# Patient Record
Sex: Female | Born: 1975 | Race: Black or African American | Hispanic: No | Marital: Married | Smoking: Never smoker
Health system: Southern US, Community
[De-identification: ages and names within clinical notes are randomized; demographics above are authoritative.]

## PROBLEM LIST (undated history)

## (undated) DIAGNOSIS — N809 Endometriosis, unspecified: Secondary | ICD-10-CM

## (undated) DIAGNOSIS — I10 Essential (primary) hypertension: Secondary | ICD-10-CM

## (undated) DIAGNOSIS — D259 Leiomyoma of uterus, unspecified: Secondary | ICD-10-CM

## (undated) HISTORY — PX: MYOMECTOMY: SHX85

## (undated) HISTORY — DX: Essential (primary) hypertension: I10

---

## 2003-04-07 ENCOUNTER — Emergency Department (HOSPITAL_COMMUNITY): Admission: EM | Admit: 2003-04-07 | Discharge: 2003-04-07 | Payer: Self-pay | Admitting: Emergency Medicine

## 2003-04-07 ENCOUNTER — Encounter: Payer: Self-pay | Admitting: Emergency Medicine

## 2003-05-02 ENCOUNTER — Encounter: Admission: RE | Admit: 2003-05-02 | Discharge: 2003-05-22 | Payer: Self-pay | Admitting: General Practice

## 2005-12-29 ENCOUNTER — Ambulatory Visit (HOSPITAL_COMMUNITY): Admission: RE | Admit: 2005-12-29 | Discharge: 2005-12-29 | Payer: Self-pay | Admitting: Family Medicine

## 2005-12-29 ENCOUNTER — Emergency Department (HOSPITAL_COMMUNITY): Admission: EM | Admit: 2005-12-29 | Discharge: 2005-12-29 | Payer: Self-pay | Admitting: Family Medicine

## 2006-05-04 ENCOUNTER — Other Ambulatory Visit: Admission: RE | Admit: 2006-05-04 | Discharge: 2006-05-04 | Payer: Self-pay | Admitting: Obstetrics and Gynecology

## 2006-06-20 ENCOUNTER — Ambulatory Visit (HOSPITAL_COMMUNITY): Admission: RE | Admit: 2006-06-20 | Discharge: 2006-06-20 | Payer: Self-pay | Admitting: Obstetrics and Gynecology

## 2006-09-14 ENCOUNTER — Inpatient Hospital Stay (HOSPITAL_COMMUNITY): Admission: RE | Admit: 2006-09-14 | Discharge: 2006-09-16 | Payer: Self-pay | Admitting: Obstetrics and Gynecology

## 2006-09-14 ENCOUNTER — Encounter (INDEPENDENT_AMBULATORY_CARE_PROVIDER_SITE_OTHER): Payer: Self-pay | Admitting: *Deleted

## 2008-02-08 ENCOUNTER — Inpatient Hospital Stay (HOSPITAL_COMMUNITY): Admission: RE | Admit: 2008-02-08 | Discharge: 2008-02-11 | Payer: Self-pay | Admitting: Obstetrics and Gynecology

## 2009-04-10 ENCOUNTER — Encounter: Admission: RE | Admit: 2009-04-10 | Discharge: 2009-04-10 | Payer: Self-pay | Admitting: Internal Medicine

## 2011-02-09 NOTE — Discharge Summary (Signed)
NAME:  NYREE, APPLEGATE               ACCOUNT NO.:  [id number]   MEDICAL RECORD NO.:  [id number]          PATIENT TYPE:  INP   LOCATION:  9117                          [facility]  [facility]   PHYSICIAN:  Naima A. Dillard, M.D. DATE OF BIRTH:  06-Sep-1976   DATE OF ADMISSION:  02/08/2008  DATE OF DISCHARGE:  02/11/2008                               DISCHARGE SUMMARY   HISTORY OF PRESENT ILLNESS:  The patient is a 35 year old gravida 1,  para 0, who presented on the date of admission at 38-6/7 weeks for a  scheduled cesarean section due to a history of a myomectomy.  Her  pregnancy had been followed by MD service at [facility].  Dr. Estanislado Pandy was her  primary and history is remarkable for: (1) Status post myomectomy, (2)  history of fibroids, (3) history of Haemophilus/pertussis vaccine, (5)  father of the baby is a twin.   HOSPITAL COURSE:  She was admitted on the day of admission for the  scheduled C-section.  She was prepped for the OR and after which  proceeded to OR, where a primary low transverse cesarean section was  performed by Dr. Dois Davenport Rivard with Wynelle Bourgeois, C.N.M. assisting.  Procedure was performed under spinal anesthesia and findings were a  viable female infant with delivery at 0950 on Feb 08, 2008 with a weight  of 7 pounds 6 ounces, length of 20 inches; Apgars were 8 and 9.  Estimated blood loss was 900 mL.  During the procedure, Dr. Estanislado Pandy  decided to exteriorize the uterus to evaluate posterior wall and no wall  defects were noted due to the pregnancy.  There were some very fine  adhesions on the posterior aspect of the uterus, which were sharply  dissected.  Tubes and ovaries appeared normal and uterus was returned  into the abdominopelvic cavity.  Baby's name was Jackelyn Hoehn.  The patient  tolerated it well and was taken to recovery room in stable and good  condition and newborn female was sent to full-term nursery.  During the  procedure, to note, sharp removal of keloid from her  previous incision  was done and the incision was vertically brought sharply to the fascia.  By postoperative day #1, the patient was doing well.  She was  spontaneously voiding.  Her vital signs were stable.  She had a T-max of  99.1.  Physical exam was within normal limits.  Abdomen was soft and  appropriately tender.  Dressing was clean, dry and intact.  Lochia  within normal limits.  Her hemoglobin was down to 11.8 from 12.0, white  count stable at 9.4, platelets were stable at 217,000 and plan was made  to continue with routine postoperative care.  By postoperative day #2,  she continued doing well.  She was ambulating, voiding without  difficulty, tolerating foods and fluids.  She was without dizziness or  weakness, positive flatus, no BM.  She was breast- and bottle-feeding.  Vital signs were stable and she was afebrile.  Her physical exam was  within normal limits.  Chest was clear.  Breasts were soft.  Fundus was  firm, below umbilicus.  She had small lochia rubra.  Incision was clean,  dry and intact without redness and staples were intact and by  postoperative day #3, the patient was well.  She was ready for  discharge, continued breast- and bottle-feeding.  She had light vaginal  bleeding.  Pain was well-controlled with Motrin and Percocet.  She has a  6-week appointment already scheduled in the office and the patient was  deemed to have received full benefit from her hospital stay and was  discharged home in stable condition on postoperative day #3.  Vital  signs remained stable and she was afebrile.  Her abdomen did remain  soft.  Fundus was firm, 1/U.  Her vertical midline Steri-Strips were dry  and intact with some old dark drainage inferior to pannus.  Her staples,  which vertically followed her incision, were removed with some  discomfort, but the patient received pain medications just following  procedure.  She had scant lochia rubra.  Extremities with 1+ pitting  edema,  bilateral lower extremities, but negative Homans.   DISCHARGE MEDICATIONS:  1. Motrin 1 tab p.o. q.6 h. p.r.n. pain.  2. Percocet 5/325 one to two tabs p.o. q.4-6 h. p.r.n. pain.  3. Encouraged stool softeners such as Colace 1 tab p.o. daily or      b.i.d. p.r.n. constipation.  4. Continue a daily multivitamin or prenatal vitamin.   DISCHARGE INSTRUCTIONS:  Postpartum instructions were per [facility] pamphlet.  Warning signs and symptoms to report were reviewed.  Followup is to  occur in 6 weeks or p.r.n.   ADMITTING DIAGNOSES:  1. Intrauterine pregnancy at 38-6/7 weeks.  2. Previous myomectomy.  3. Desiring cesarean delivery.   DISCHARGE DIAGNOSES:  1. Status post primary low transverse cesarean section secondary to a      previous myomectomy with findings of a viable female infant by the      name of Jackelyn Hoehn, who was born at 91, Feb 08, 2007, with a weight      of 7 pounds 6 ounces and Apgars 8 and 9.  2. Intrauterine pregnancy at term.  3. Breast-feeding and bottle-feeding.   HOSPITAL PROCEDURES:  1. Spinal anesthesia.  2. Primary low transverse cesarean section.      Candice Bellwood, CNM      Naima A. Normand Sloop, M.D.  Electronically Signed    CHS/MEDQ  D:  02/11/2008  T:  02/11/2008  Job:  [id number]

## 2011-02-09 NOTE — Op Note (Signed)
NAME:  Kristine Beard, Kristine Beard               ACCOUNT NO.:  [id number]   MEDICAL RECORD NO.:  [id number]          PATIENT TYPE:  INP   LOCATION:  9117                          [facility]   PHYSICIAN:  Crist Fat. Rivard, M.D. DATE OF BIRTH:  [birth year]   DATE OF PROCEDURE:  02/08/2008  DATE OF DISCHARGE:                               OPERATIVE REPORT   PREOPERATIVE DIAGNOSIS:  Intrauterine pregnancy at 38 weeks and 6 days  status post myomectomy.   POSTOPERATIVE DIAGNOSIS:  Intrauterine pregnancy at 38 weeks and 6 days  status post myomectomy.   ANESTHESIA:  Spinal.   PROCEDURE:  Primary low transverse cesarean section.   SURGEON:  Crist Fat. Rivard, MD   ASSISTANT:  Elby Showers. Mayford Knife, certified nurse midwife.   ESTIMATED BLOOD LOSS:  900 mL.   PROCEDURE:  After being informed of the planned procedure with possible  complications including bleeding, infection, injury to bowel, bladder,  or ureters, informed consent was obtained.  The patient was taken to OR  #1, given spinal anesthesia without complication.  She was placed in the  dorsal decubitus position, pelvis tilted to the left, prepped and draped  in a sterile fashion, and a Foley catheter was inserted in her bladder.   After assessing adequate level of anesthesia, we infiltrated the  vertical incision using 20 mL of Marcaine 0.25, and we sharply removed  the keloid previous incision.  This incision was then vertically brought  sharply to the fascia and the fascia was incised in the midline fashion.  Linea alba was identified.  The rectus muscle was moved to the side and  peritoneum was entered vertically.   OBSERVATION:  We see a normal appearing uterus with no adhesions in the  anterior aspect and a free moving bladder in the lower uterine segment.  A Alexis retractor is inserted easily and we now open the visceral  peritoneum in a low transverse fashion allowing [place] to safely retract  bladder by developing a bladder flap.   Myometrium is then easily entered  in a low transverse fashion first with knife then extended bluntly.  The  amniotic fluid is thin meconium.  We assist the birth of a female infant  at 9:50 a.m.  Mouth and nose are suctioned, a nuchal cord is reduced,  and the baby is delivered.  The cord was then clamped with two Kelly  clamps and sectioned and the baby is given to the pediatrician in the  room.  10 mL of blood is drawn from the umbilical vein and the placenta  is allowed to deliver spontaneously.  It is complete, cord has three  vessels, and uterine revision is negative.  Namely, there are no intra-  uterine syncytia.   At this time, we decided to exteriorize the uterus to evaluate its  posterior wall and we see no wall defect due to the pregnancy.  Very  fine adhesions on the posterior aspect of the uterus are sharply  dissected.  Both tubes and ovaries are normal.  The uterus was returned  into the abdominal pelvic cavity.   We now  proceed with closure of the myometrium in two layers, one with a  running locked suture of 0 Vicryl then with a Lembert suture of 0 Vicryl  imbricating the first one.  A figure-of-eight stitch is placed in the  middle of the incision to complete hemostasis.  We then cleaned the  paracolic gutters and irrigate profusely the pelvis with warm saline,  and hemostasis is completed on peritoneal edges with cauterization.   Retractors and sponges are removed.  Under fascia hemostasis is  completed with cautery and the fascia is closed with two running sutures  of 1 Vicryl meeting midline.  The wound is profusely irrigated with warm  saline and its hemostasis is completed with cautery.  We placed three  retention sutures of 0 Vicryl in order to be able to place our staples  on the skin in a satisfactory fashion.  The retention sutures are then  removed and Steri-Strips are placed in between each of the staples.   Instruments and sponge count is complete x2.   Estimated blood loss is  900 mL.  The procedure is very well tolerated by the patient, who was  taken to recovery room in a well and stable condition.   A little boy named Jackelyn Hoehn was born at 9:50 a.m. received an Apgar of 8  at 1 minute and 9 at 5 minutes and weighed 7 pounds 6 ounces.   SPECIMEN:  Placenta sent to labor and delivery.      Crist Fat Rivard, M.D.  Electronically Signed     SAR/MEDQ  D:  02/08/2008  T:  02/09/2008  Job:  [id number]

## 2011-02-09 NOTE — H&P (Signed)
NAME:  Kristine Beard, Kristine Beard               ACCOUNT NO.:  [id number]   MEDICAL RECORD NO.:  [id number]          PATIENT TYPE:  INP   LOCATION:  NA                            [facility]   PHYSICIAN:  Dois Davenport A. Rivard, M.D. DATE OF BIRTH:  May 02, 1976   DATE OF ADMISSION:  DATE OF DISCHARGE:                              HISTORY & PHYSICAL   HISTORY:  This is a 35 year old a gravida 1, para 0 at 36 and 6/7th  weeks, who presents for scheduled cesarean delivery due to a history of  myomectomy.  Pregnancy has been followed by Dr. Estanislado Pandy and remarkable  for:  1. Father of the baby is a twin.  2. History of Haemophilus pertussis vaccine.  3. Fibroids.  4. Status post myomectomy.   ALLERGIES:  CHLOROQUINE, causes itching.   OB HISTORY:  The patient is primigravida.   MEDICAL HISTORY:  Remarkable for history of multiple uterine fibroids  removed by a myomectomy in 2007, and childhood varicella.   SURGICAL HISTORY:  Remarkable for myomectomy in 2007, which was removed,  and multiple fibroids.   FAMILY HISTORY:  Remarkable for father with hypertension.   GENETIC HISTORY:  Remarkable for father of baby being a twin.   SOCIAL HISTORY:  The patient is married to Arvin Collard, who is  involved and supportive.  She is of the [place] faith.  She is a  Consulting civil engineer.  She denies any alcohol, tobacco, or drug use.   PRENATAL LABS:  Hemoglobin 12.4 and platelets 264.  Blood type O+.  Antibody screen negative.  Sickle cell negative.  Toxin negative.  RPR  nonreactive.  Rubella immune.  Hepatitis negative.  HIV negative.  Pap  test normal.  Gonorrhea negative.  Chlamydia negative.  Cystic fibrosis  negative.   HISTORY OF CURRENT PREGNANCY:  The patient entered care at 7 weeks'  gestation.  She had an ultrasound in the first trimester showing a  viable intrauterine pregnancy with a posterior fibroid.  She had some  nausea and vomiting treated with Phenergan.  She declined quad screen.  She had an  ultrasound for anatomy at 18 weeks that was normal.  She had  Glucola at 26 weeks that was 91, and she had a group B strep that was  negative.   OBJECTIVE DATA:  VITAL SIGNS:  Stable.  Afebrile.  HEENT:  Within normal limits.  Thyroid normal, not enlarged.  CHEST:  Clear to auscultation.  HEART:  Regular rate and rhythm.  ABDOMEN:  Gravid at 38 cm with a healed vertical abdominal scar.  PELVIC:  Deferred.  EXTREMITIES:  Within normal limits except for her mild edema.   ASSESSMENT:  1. Intrauterine pregnancy at 55 and 6/7th weeks.  2. Previous myomectomy.  3. Desires cesarean delivery.   PLAN:  1. Admit to operating suite per Dr. Estanislado Pandy.  2. Further orders to follow.      Marie L. Williams, C.N.M.      Crist Fat Rivard, M.D.  Electronically Signed    MLW/MEDQ  D:  02/07/2008  T:  02/08/2008  Job:  [id number]

## 2011-02-12 NOTE — Op Note (Signed)
NAME:  Kristine Beard, Kristine Beard               ACCOUNT NO.:  [id number]   MEDICAL RECORD NO.:  [id number]          PATIENT TYPE:  AMB   LOCATION:  SDC                           [facility]   PHYSICIAN:  Dois Davenport A. Rivard, M.D. DATE OF BIRTH:  1976-02-17   DATE OF PROCEDURE:  09/14/2006  DATE OF DISCHARGE:                               OPERATIVE REPORT   PREOPERATIVE DIAGNOSES:  1. Large fibroid uterus.  2. Dysmenorrhea.  3. Menorrhagia.  4. Anemia.  5. Left hydronephrosis.   POSTOPERATIVE DIAGNOSES:  1. Large fibroid uterus.  2. Dysmenorrhea.  3. Menorrhagia.  4. Anemia.  5. Left hydronephrosis.   ANESTHESIA:  General with endotracheal intubation.   PROCEDURE:  Abdominal myomectomy.   SURGEON:  Crist Fat. Rivard, M.D.   ASSISTANTMarquis Lunch. Lowell Guitar, P.A.   ESTIMATED BLOOD LOSS:  350 mL.   PROCEDURE:  After being informed of the planned procedure with possible  complications including bleeding, infection, injury to bowel, bladder or  ureters, postoperative tubal occlusion as well as growth of new  fibroids, informed consent is obtained.  The patient is taken to OR #3,  given general anesthesia with endotracheal intubation without  complication.  She is placed in the dorsal decubitus position, prepped  and draped in a sterile fashion.  A Foley catheter is inserted in her  bladder and a 8 [place] intrauterine catheter is inserted in her uterus.  We infiltrate from the suprapubic area to the umbilicus using 18 mL of  Marcaine 0.25 and we perform a midline incision until 1.5 cm of the  umbilicus.  This incision is brought to the fascia.  The fascia is  identified, incised in the midline fashion and the linea alba is  dissected.  Peritoneum is entered bluntly.   OBSERVATION:  The uterus is very large with a dominant posterior fibroid  measuring approximately 12 cm in diameter and then a right anterior  fibroid measuring 5 cm and a subserosal fibroid measuring 2 cm at the  right  cornua.  Tubes appeared normal, ovaries appeared normal.  It  should be noted that the left tube was completely compressed anteriorly  upon opening of the abdominal cavity.  The liver edge is felt and  normal.  Gallbladder is normal.  No other anomalies are noted in the  pelvic or abdominal cavity.  With some difficulty, the uterus was  exteriorized from the abdominal cavity and we infiltrate the serosa on  the posterior wall of the uterus using 10 mL of vasopressin 20 units in  50 mL dilution.  The serosa is then opened with cautery and the fibroid  is grasped with a corkscrew manipulator.  We then proceed with sharp and  blunt dissection of the fibroid away from the body of the uterus.  To be  noted that there are no clear planes of dissection and this fibroid is  quite soft either degenerating or has a component of adenomyosis.  Fibroid is removed completely, but in the process a small opening is  performed in the posterior endometrial cavity.  We closed that cavity  with  interrupted 2-0 Vicryl suture and then we proceed with systematic  closure of the myometrium in multiple planes using 0 Vicryl figure-of-  eight stitches and pursestring sutures.  The excess of serosa is excised  and we reapproximate the myometrium completely until the serosa is  reapproximated.  We then put our attention on the anterior fibroid and  we infiltrate the serosa with 10 mL of vasopressin 20 units in 50 mL  dilution and we opened the serosa with cautery.  The fibroid is grasped  with a towel clip and sharply and bluntly dissected away from the  uterine wall.  Again, there is no clear plane of dissection and we again  enter the endometrial cavity.  The fibroid is completely removed and the  endometrial cavity is closed with interrupted sutures of 2-0 Vicryl.  The myometrium is then closed with figure-of-eight stitches of 2-0  Vicryl.   Please note that before closing the posterior wall, another 3 cm  fibroid  was removed easily and had clear planes of dissection.  We then close  the serosa on both the anterior and posterior incision using 2-0 Vicryl  in a baseball suture.  Hemostasis is adequate.  The subserosal cornual  fibroid is then grasped with a towel clip and removed using  cauterization and blunt dissection a figure-of-eight stitch of 2-0  Vicryl is used to close the serosa.  Again hemostasis is adequate.  We  then profusely irrigate the pelvis with warm saline and note a  satisfactory hemostasis.  A sheet of intercede was placed on both  incisions and the uterus is returned in the pelvic cavity.  The under  fascia hemostasis is completed with cautery and the fascia is closed  with two running sutures of #1 Vicryl meeting midline.  The wound is  then irrigated with warm saline.  Hemostasis is completed with cautery  and the incision is closed with a subcuticular suture of 3-0 Monocryl  and Steri-Strips.   Instruments and sponge count is complete x2.  Estimated blood loss is  350 mL.  The procedure is very well tolerated by the patient who is  taken to recovery room in a well and stable condition.      Crist Fat Rivard, M.D.  Electronically Signed     SAR/MEDQ  D:  09/14/2006  T:  09/14/2006  Job:  [id number]

## 2011-02-12 NOTE — H&P (Signed)
NAME:  Kristine Beard, Kristine Beard NO.:  [id number]   MEDICAL RECORD NO.:  [id number]         PATIENT TYPE:  AMB   LOCATION:                                [facility]   PHYSICIAN:  Crist Fat. Rivard, M.D. DATE OF BIRTH:  [birth year]   DATE OF ADMISSION:  09/14/2006  DATE OF DISCHARGE:                              HISTORY & PHYSICAL   HISTORY OF PRESENT ILLNESS:  [name] is a 35 year old, married,  African-American female, para 0-0-1-0, who presents for myomectomy  because of a large symptomatic fibroid uterus and left hydronephrosis.  The patient was seen at [facility] in April 2007 with  complaints of abdominal pain.  She was found at that time to have a  normal CBC except for a hemoglobin of 11.1, a negative H. pylori test,  urinalysis, and pregnancy test, along with normal electrolytes.  The  patient's creatinine, however, was found to be 1.1.  An abdominal  ultrasound showed a 9 cm right kidney and a 10.1 cm left kidney with  blunting of calices and mild to moderate left hydronephrosis.  The  patient was then referred for urologic evaluation; however, she did not  follow through.  She was seen at [facility] in August 2007  with complaints of severe back pain, rectal pressure, heavy periods,  urinary frequency with urgency, flatulence, and positional dyspareunia.  Her periods were described as lasting 5 days with a change of a long,  heavy-flow pad every 4 hours.  The patient's cramps were rated at 8/10  on a 10-point pain scale; however, she was able to find relief with  Aleve.  A pelvic ultrasound at that time demonstrated a uterus measuring  18 x 10.5 x 15 cm with 3 measurable fibroids being 8.1 cm, 5.4 cm, and a  pedunculated 5.0 cm.  Multiple smaller ones were also noted.  The  patient's left and right ovaries appeared within normal limits.  A  subsequently histosalpingogram revealed an enlarged deviated endometrial  canal with patulous  morphology suggesting extrinsic compression from a  large left-sided fibroid.  The patient's right fallopian tube was  patent.  Her left fallopian tube showed no filling at the level of the  level of the cornua suggesting occlusion.  Given patient's desire to  preserve her fertility, she wishes to pursue management of her condition  through myomectomy.   OB HISTORY:  Gravida 1, para 0-0-1-0.   GYN HISTORY:  Menarche at 35 years old.  Last menstrual period August 29, 2006.  The patient has no history of sexually transmitted diseases or  abnormal Pap smears.  Her last Pap smear was August 2007.   PAST MEDICAL HISTORY:  Negative.   PAST SURGICAL HISTORY:  Negative.   FAMILY HISTORY:  Positive for hypertension.   SOCIAL HISTORY:  The patient is married, and she works as a Probation officer.   HABITS:  She does not use alcohol or tobacco.   CURRENT MEDICATIONS:  Aleve 2 tablets twice daily with food as needed.   ALLERGIES:  1. The  patient is allergic to CHLOROQUINE which causes itching.  2. She is also sensitive to VICODIN which makes her nauseous.   REVIEW OF SYSTEMS:  Negative for chest pain, shortness of breath,  headache, vision changes, nausea, vomiting, diarrhea, or fever.  Except  as mentioned in History of Present Illness, the patient's Review of  Systems is otherwise negative.   PHYSICAL EXAMINATION:  VITAL SIGNS: Blood pressure 130/80.  Weight 140,  height 5 feet 6 inches tall.  NECK:  Supple.  There are no masses, adenopathy, or thyromegaly.  HEART: Regular rate and rhythm.  The patient has a 2/6 systolic ejection  murmur at the left sternal border which is asymptomatic.  LUNGS: Clear to auscultation.  There are no wheezes, rales, or rhonchi.  BACK: No CVA tenderness.  ABDOMEN:  Without tenderness or organomegaly; however, there is a firm  mass to the level of patient's umbilicus arising from the pelvis which  is palpable.  EXTREMITIES:  No clubbing, cyanosis, or  edema.  PELVIC:  EG/BUS is within normal limits.  Vagina is rugose.  There is  dark blood in the vaginal vault.  Cervix is nontender without lesions.  Uterus appeared 18-week size, tender, and irregular.  Adnexa without  tenderness or palpable masses.   IMPRESSION:  1. Large symptomatic fibroid uterus.  2. Dysmenorrhea.  3. Menorrhagia.   DISPOSITION:  A discussion was held with the patient regarding the  indications for her procedure along with its risks which include but are  not limited to reaction to anesthesia, damage to adjacent organs,  infection, excessive bleeding, and the remote possibility that a  hysterectomy may become necessary.  The patient verbalized understanding  of these risks and has consented to proceed with an abdominal myomectomy  at [facility] on September 14, 2006, at 8:30 a.m.      Kristine Beard.      Crist Fat Rivard, M.D.  Electronically Signed    EJP/MEDQ  D:  09/12/2006  T:  09/12/2006  Job:  [id number]

## 2011-02-12 NOTE — Discharge Summary (Signed)
NAME:  Kristine Beard, Kristine Beard               ACCOUNT NO.:  [id number]   MEDICAL RECORD NO.:  [id number]          PATIENT TYPE:  INP   LOCATION:  9313                          FACILITY:  [facility]   PHYSICIAN:  Crist Fat. Rivard, M.D. DATE OF BIRTH:  04-19-1976   DATE OF ADMISSION:  09/14/2006  DATE OF DISCHARGE:  09/16/2006                               DISCHARGE SUMMARY   DISCHARGE DIAGNOSES:  1. Symptomatic uterine fibroids.  2. Adenomyosis.  3. Dysmenorrhea.  4. Menorrhagia.  5. Left hydronephrosis.   OPERATION:  On the date of admission, the patient underwent an abdominal  myomectomy, tolerating procedure well.  From the patient's markedly  enlarged uterus, four fibroids were removed with an aggregate weight of  963 grams.   HISTORY OF PRESENT ILLNESS:  Ms. Dershem is a 35 year old, married,  African American female, para 0-0-1-0, who presents for myomectomy  because of a large symptomatic fibroid uterus and left hydronephrosis.  Please see the patient's dictated History and Physical examination for  details.   PREOPERATIVE PHYSICAL EXAM:  VITAL SIGNS: Blood pressure 130/80. Weight  was 140. Height was 5 feet 6 inches tall. GENERAL:  Exam was within  normal limits.  ABDOMEN:  On abdominal exam, the patient had a firm mass arising from  the pelvis to the level of the patient's umbilicus which was without  tenderness, and there was no organomegaly.  PELVIC:  EG/BUS was within normal limits.  Vagina was rugose.  There was  dark blood in the vaginal wall  the cervix was nontender without  lesions.  Uterus appeared 18-week size, tender and irregular.  Adnexa  was without tenderness or palpable masses   HOSPITAL COURSE:  On the day of admission, the patient underwent  aforementioned procedure, tolerating it well.  Postoperative course was  marked by the patient spiking a temperature of 100.5 degrees Fahrenheit  orally on postop day #2; however, she quickly defervesced by postop day  #2.   Additionally, the patient had a postop hemoglobin of 6.8 (preop  hemoglobin 10.4).  However, she was only minimally symptomatic and  declined transfusion.   By postop day #2, the patient had resumed bowel and bladder function,  was no longer febrile, and was, therefore, deemed ready for discharge  home.  The patient's basic metabolic panel during her hospital stay was  found to be within normal limits.   DISCHARGE MEDICATIONS:  1. Percocet 5/325 mg 1-2 tablets every 4 hours as needed for pain.  2. Ibuprofen 600 mg with food every 6 hours for 3 days, then as needed      for pain.  3. Colace 100 mg two to three times daily until bowel movements are      regular.  4. Ferrous sulfate 325 mg twice daily for 6 weeks.  5. Phenergan 25 mg every 6 hours with nausea.   FOLLOWUP:  The patient is scheduled for 6-week postoperative visit with  Dr. Estanislado Pandy on October 25, 2006, at 11 o'clock a.m.   DISCHARGE INSTRUCTIONS:  The patient was given a copy of [facility] postoperative instruction sheet.  She was further  advised to avoid driving for 2 weeks, heavy lifting for 4 weeks,  intercourse for 6 weeks.  She may walk up steps.  She may shower and  bathe.  She may increase her activities slowly.  She is to keep her  incision clean, dry and that she is able to use soap and water.  Her  diet was without restriction.   FINAL PATHOLOGY:  Uterine fibroids: Benign leiomyomas and nodular foci  of adenomyosis associated with fibrosis and focal hemorrhage.   Peritoneum biopsy of the uterine serosa:  Focal hyperemia. No  endometriosis identified.      [place] J. Adline Peals.      Crist Fat Rivard, M.D.  Electronically Signed    EJP/MEDQ  D:  10/03/2006  T:  10/03/2006  Job:  [id number]

## 2011-06-23 LAB — CBC
HCT: 34.7 — ABNORMAL LOW
MCV: 91.4
Platelets: 217
RDW: 14.2

## 2013-05-01 ENCOUNTER — Other Ambulatory Visit: Payer: Self-pay | Admitting: Obstetrics

## 2013-05-21 ENCOUNTER — Encounter (HOSPITAL_COMMUNITY): Payer: Self-pay

## 2013-05-24 NOTE — H&P (Signed)
NAME:  Kristine Beard, Kristine Beard NO.:  [id number]  MEDICAL RECORD NO.:  [id number]  LOCATION:                                 FACILITY:  PHYSICIAN:  Kathreen Cosier, M.D.DATE OF BIRTH:  [birth year]  DATE OF ADMISSION:  05/31/2013 DATE OF DISCHARGE:                             HISTORY & PHYSICAL   Date of surgery is May 31, 2013.  HISTORY OF PRESENT ILLNESS:  The patient is a 37 year old, gravida 2, para 1-0-0-0-1, [facility] June 02, 2013, and she had a previous multiple myomectomies and C-section, and she is in for repeat C-section at term because of her previous history.  PAST MEDICAL HISTORY:  Negative.  PAST SURGICAL HISTORY:  As stated above, multiple myomectomies and cesarean section.  SOCIAL HISTORY:  Negative.  SYSTEM REVIEW:  Negative.  PHYSICAL EXAMINATION:  GENERAL:  Well-developed female in no labor. HEENT:  Negative. LUNGS:  Clear to P and A. HEART:  Regular rhythm.  No murmurs, no gallops. ABDOMEN:  Term. EXTREMITIES:  Negative.          ______________________________ Kathreen Cosier, M.D.     BAM/MEDQ  D:  05/24/2013  T:  05/24/2013  Job:  [id number]

## 2013-05-25 ENCOUNTER — Encounter (HOSPITAL_COMMUNITY): Payer: Self-pay | Admitting: *Deleted

## 2013-05-25 ENCOUNTER — Encounter (HOSPITAL_COMMUNITY): Payer: Self-pay | Admitting: Anesthesiology

## 2013-05-25 ENCOUNTER — Encounter (HOSPITAL_COMMUNITY)
Admission: AD | Disposition: A | Discharge status: Home / Self Care | Payer: Self-pay | Source: Ambulatory Visit | Attending: Obstetrics

## 2013-05-25 ENCOUNTER — Inpatient Hospital Stay (HOSPITAL_COMMUNITY): Payer: Medicaid Other | Admitting: Anesthesiology

## 2013-05-25 ENCOUNTER — Inpatient Hospital Stay (HOSPITAL_COMMUNITY)
Admission: AD | Admit: 2013-05-25 | Discharge: 2013-05-28 | DRG: 766 | Disposition: A | Discharge status: Home / Self Care | Payer: Medicaid Other | Source: Ambulatory Visit | Attending: Obstetrics | Admitting: Obstetrics

## 2013-05-25 DIAGNOSIS — O34219 Maternal care for unspecified type scar from previous cesarean delivery: Principal | ICD-10-CM | POA: Diagnosis present

## 2013-05-25 DIAGNOSIS — O99013 Anemia complicating pregnancy, third trimester: Secondary | ICD-10-CM

## 2013-05-25 DIAGNOSIS — IMO0002 Reserved for concepts with insufficient information to code with codable children: Secondary | ICD-10-CM | POA: Diagnosis not present

## 2013-05-25 DIAGNOSIS — O09529 Supervision of elderly multigravida, unspecified trimester: Secondary | ICD-10-CM | POA: Diagnosis present

## 2013-05-25 DIAGNOSIS — Z98891 History of uterine scar from previous surgery: Secondary | ICD-10-CM | POA: Diagnosis not present

## 2013-05-25 HISTORY — DX: Endometriosis, unspecified: N80.9

## 2013-05-25 HISTORY — DX: Leiomyoma of uterus, unspecified: D25.9

## 2013-05-25 LAB — CBC
HCT: 34.7 % — ABNORMAL LOW (ref 36.0–46.0)
MCHC: 34.3 g/dL (ref 30.0–36.0)
MCV: 88.5 fL (ref 78.0–100.0)
Platelets: 205 10*3/uL (ref 150–400)
RDW: 14 % (ref 11.5–15.5)
WBC: 7.2 10*3/uL (ref 4.0–10.5)

## 2013-05-25 LAB — RAPID HIV SCREEN (WH-MAU): Rapid HIV Screen: NONREACTIVE

## 2013-05-25 SURGERY — Surgical Case
Anesthesia: Spinal

## 2013-05-25 MED ORDER — CITRIC ACID-SODIUM CITRATE 334-500 MG/5ML PO SOLN
30.0000 mL | Freq: Once | ORAL | Status: AC
  Administered 2013-05-25: 30 mL via ORAL

## 2013-05-25 MED ORDER — ONDANSETRON HCL 4 MG/2ML IJ SOLN
INTRAMUSCULAR | Status: AC
  Filled 2013-05-25: qty 2

## 2013-05-25 MED ORDER — NALOXONE HCL 0.4 MG/ML IJ SOLN: 0.4000 mg | INTRAMUSCULAR | Status: DC | PRN

## 2013-05-25 MED ORDER — DEXAMETHASONE SODIUM PHOSPHATE 10 MG/ML IJ SOLN
INTRAMUSCULAR | Status: AC
  Filled 2013-05-25: qty 1

## 2013-05-25 MED ORDER — TETANUS-DIPHTH-ACELL PERTUSSIS 5-2.5-18.5 LF-MCG/0.5 IM SUSP
0.5000 mL | Freq: Once | INTRAMUSCULAR | Status: AC
  Administered 2013-05-25: 0.5 mL via INTRAMUSCULAR

## 2013-05-25 MED ORDER — SENNOSIDES-DOCUSATE SODIUM 8.6-50 MG PO TABS
2.0000 | ORAL_TABLET | Freq: Every day | ORAL | Status: DC
  Administered 2013-05-25 – 2013-05-27 (×3): 2 via ORAL

## 2013-05-25 MED ORDER — FENTANYL CITRATE 0.05 MG/ML IJ SOLN
INTRAMUSCULAR | Status: DC | PRN
  Administered 2013-05-25: 85 ug via INTRAVENOUS

## 2013-05-25 MED ORDER — LACTATED RINGERS IV SOLN
INTRAVENOUS | Status: DC | PRN
  Administered 2013-05-25 (×3): via INTRAVENOUS

## 2013-05-25 MED ORDER — METOCLOPRAMIDE HCL 5 MG/ML IJ SOLN
10.0000 mg | Freq: Three times a day (TID) | INTRAMUSCULAR | Status: DC | PRN
  Administered 2013-05-25: 10 mg via INTRAVENOUS

## 2013-05-25 MED ORDER — ONDANSETRON HCL 4 MG PO TABS
4.0000 mg | ORAL_TABLET | ORAL | Status: DC | PRN
  Filled 2013-05-25: qty 1

## 2013-05-25 MED ORDER — SCOPOLAMINE 1 MG/3DAYS TD PT72
MEDICATED_PATCH | TRANSDERMAL | Status: AC
  Filled 2013-05-25: qty 1

## 2013-05-25 MED ORDER — PRENATAL MULTIVITAMIN CH
1.0000 | ORAL_TABLET | Freq: Every day | ORAL | Status: DC
  Administered 2013-05-27: 1 via ORAL
  Filled 2013-05-25: qty 1

## 2013-05-25 MED ORDER — ONDANSETRON HCL 4 MG/2ML IJ SOLN
INTRAMUSCULAR | Status: DC | PRN
  Administered 2013-05-25: 4 mg via INTRAVENOUS

## 2013-05-25 MED ORDER — MORPHINE SULFATE (PF) 0.5 MG/ML IJ SOLN
INTRAMUSCULAR | Status: DC | PRN
  Administered 2013-05-25: 4.9 mg via INTRAVENOUS

## 2013-05-25 MED ORDER — MORPHINE SULFATE (PF) 0.5 MG/ML IJ SOLN
INTRAMUSCULAR | Status: DC | PRN
  Administered 2013-05-25: .1 mg via INTRATHECAL

## 2013-05-25 MED ORDER — NALBUPHINE SYRINGE 5 MG/0.5 ML
5.0000 mg | INJECTION | INTRAMUSCULAR | Status: DC | PRN
  Filled 2013-05-25: qty 1

## 2013-05-25 MED ORDER — KETOROLAC TROMETHAMINE 60 MG/2ML IM SOLN
INTRAMUSCULAR | Status: AC
  Filled 2013-05-25: qty 2

## 2013-05-25 MED ORDER — CEFAZOLIN SODIUM-DEXTROSE 2-3 GM-% IV SOLR: 2.0000 g | Freq: Once | INTRAVENOUS | Status: DC

## 2013-05-25 MED ORDER — MORPHINE SULFATE 0.5 MG/ML IJ SOLN
INTRAMUSCULAR | Status: AC
  Filled 2013-05-25: qty 10

## 2013-05-25 MED ORDER — MEPERIDINE HCL 25 MG/ML IJ SOLN: 6.2500 mg | INTRAMUSCULAR | Status: DC | PRN

## 2013-05-25 MED ORDER — FENTANYL CITRATE 0.05 MG/ML IJ SOLN
INTRAMUSCULAR | Status: DC | PRN
  Administered 2013-05-25: 15 ug via INTRATHECAL

## 2013-05-25 MED ORDER — KETOROLAC TROMETHAMINE 30 MG/ML IJ SOLN
30.0000 mg | Freq: Four times a day (QID) | INTRAMUSCULAR | Status: AC | PRN
  Filled 2013-05-25: qty 1

## 2013-05-25 MED ORDER — KETOROLAC TROMETHAMINE 60 MG/2ML IM SOLN
60.0000 mg | Freq: Once | INTRAMUSCULAR | Status: AC | PRN
  Administered 2013-05-25: 60 mg via INTRAMUSCULAR

## 2013-05-25 MED ORDER — LANOLIN HYDROUS EX OINT: 1.0000 "application " | TOPICAL_OINTMENT | CUTANEOUS | Status: DC | PRN

## 2013-05-25 MED ORDER — CITRIC ACID-SODIUM CITRATE 334-500 MG/5ML PO SOLN
ORAL | Status: AC
  Administered 2013-05-25: 30 mL via ORAL
  Filled 2013-05-25: qty 15

## 2013-05-25 MED ORDER — SIMETHICONE 80 MG PO CHEW
80.0000 mg | CHEWABLE_TABLET | ORAL | Status: DC | PRN
  Administered 2013-05-26 – 2013-05-27 (×2): 80 mg via ORAL

## 2013-05-25 MED ORDER — SODIUM CHLORIDE 0.9 % IJ SOLN: 3.0000 mL | INTRAMUSCULAR | Status: DC | PRN

## 2013-05-25 MED ORDER — LACTATED RINGERS IV BOLUS (SEPSIS)
1000.0000 mL | Freq: Once | INTRAVENOUS | Status: AC
  Administered 2013-05-25: 1000 mL via INTRAVENOUS

## 2013-05-25 MED ORDER — ONDANSETRON HCL 4 MG/2ML IJ SOLN: 4.0000 mg | Freq: Three times a day (TID) | INTRAMUSCULAR | Status: DC | PRN

## 2013-05-25 MED ORDER — MENTHOL 3 MG MT LOZG: 1.0000 | LOZENGE | OROMUCOSAL | Status: DC | PRN

## 2013-05-25 MED ORDER — FAMOTIDINE IN NACL 20-0.9 MG/50ML-% IV SOLN
20.0000 mg | Freq: Once | INTRAVENOUS | Status: AC
  Administered 2013-05-25: 20 mg via INTRAVENOUS

## 2013-05-25 MED ORDER — NALBUPHINE SYRINGE 5 MG/0.5 ML
5.0000 mg | INJECTION | INTRAMUSCULAR | Status: DC | PRN
  Administered 2013-05-25: 10 mg via SUBCUTANEOUS
  Administered 2013-05-26: 5 mg via SUBCUTANEOUS
  Filled 2013-05-25 (×4): qty 1

## 2013-05-25 MED ORDER — BUPIVACAINE IN DEXTROSE 0.75-8.25 % IT SOLN
INTRATHECAL | Status: DC | PRN
  Administered 2013-05-25: 10.75 mg via INTRATHECAL

## 2013-05-25 MED ORDER — METOCLOPRAMIDE HCL 5 MG/ML IJ SOLN
INTRAMUSCULAR | Status: AC
  Filled 2013-05-25: qty 2

## 2013-05-25 MED ORDER — CEFAZOLIN SODIUM-DEXTROSE 2-3 GM-% IV SOLR
INTRAVENOUS | Status: AC
  Administered 2013-05-25: 2 g via INTRAVENOUS
  Filled 2013-05-25: qty 50

## 2013-05-25 MED ORDER — DIPHENHYDRAMINE HCL 50 MG/ML IJ SOLN: 12.5000 mg | INTRAMUSCULAR | Status: DC | PRN

## 2013-05-25 MED ORDER — DIPHENHYDRAMINE HCL 25 MG PO CAPS: 25.0000 mg | ORAL_CAPSULE | Freq: Four times a day (QID) | ORAL | Status: DC | PRN

## 2013-05-25 MED ORDER — IBUPROFEN 600 MG PO TABS
600.0000 mg | ORAL_TABLET | Freq: Four times a day (QID) | ORAL | Status: DC
  Administered 2013-05-25 – 2013-05-28 (×9): 600 mg via ORAL
  Filled 2013-05-25 (×9): qty 1

## 2013-05-25 MED ORDER — OXYTOCIN 10 UNIT/ML IJ SOLN
40.0000 [IU] | INTRAVENOUS | Status: DC | PRN
  Administered 2013-05-25: 40 [IU] via INTRAVENOUS

## 2013-05-25 MED ORDER — FAMOTIDINE IN NACL 20-0.9 MG/50ML-% IV SOLN
INTRAVENOUS | Status: AC
  Administered 2013-05-25: 20 mg via INTRAVENOUS
  Filled 2013-05-25: qty 50

## 2013-05-25 MED ORDER — OXYTOCIN 40 UNITS IN LACTATED RINGERS INFUSION - SIMPLE MED: 62.5000 mL/h | INTRAVENOUS | Status: AC

## 2013-05-25 MED ORDER — DIPHENHYDRAMINE HCL 25 MG PO CAPS: 25.0000 mg | ORAL_CAPSULE | ORAL | Status: DC | PRN

## 2013-05-25 MED ORDER — LACTATED RINGERS IV SOLN: INTRAVENOUS | Status: DC

## 2013-05-25 MED ORDER — FENTANYL CITRATE 0.05 MG/ML IJ SOLN
INTRAMUSCULAR | Status: AC
  Filled 2013-05-25: qty 2

## 2013-05-25 MED ORDER — ZOLPIDEM TARTRATE 5 MG PO TABS: 5.0000 mg | ORAL_TABLET | Freq: Every evening | ORAL | Status: DC | PRN

## 2013-05-25 MED ORDER — SCOPOLAMINE 1 MG/3DAYS TD PT72
1.0000 | MEDICATED_PATCH | Freq: Once | TRANSDERMAL | Status: AC
  Administered 2013-05-25: 1.5 mg via TRANSDERMAL

## 2013-05-25 MED ORDER — SIMETHICONE 80 MG PO CHEW
80.0000 mg | CHEWABLE_TABLET | Freq: Three times a day (TID) | ORAL | Status: DC
  Administered 2013-05-25 – 2013-05-28 (×6): 80 mg via ORAL

## 2013-05-25 MED ORDER — WITCH HAZEL-GLYCERIN EX PADS: 1.0000 "application " | MEDICATED_PAD | CUTANEOUS | Status: DC | PRN

## 2013-05-25 MED ORDER — DIPHENHYDRAMINE HCL 50 MG/ML IJ SOLN: 25.0000 mg | INTRAMUSCULAR | Status: DC | PRN

## 2013-05-25 MED ORDER — FENTANYL CITRATE 0.05 MG/ML IJ SOLN: 25.0000 ug | INTRAMUSCULAR | Status: DC | PRN

## 2013-05-25 MED ORDER — DEXAMETHASONE SODIUM PHOSPHATE 10 MG/ML IJ SOLN
INTRAMUSCULAR | Status: DC | PRN
  Administered 2013-05-25: 10 mg via INTRAVENOUS

## 2013-05-25 MED ORDER — NALOXONE HCL 1 MG/ML IJ SOLN
1.0000 ug/kg/h | INTRAMUSCULAR | Status: DC | PRN
  Filled 2013-05-25: qty 2

## 2013-05-25 MED ORDER — KETOROLAC TROMETHAMINE 30 MG/ML IJ SOLN
30.0000 mg | Freq: Four times a day (QID) | INTRAMUSCULAR | Status: AC | PRN
  Administered 2013-05-25 (×2): 30 mg via INTRAVENOUS

## 2013-05-25 MED ORDER — OXYCODONE-ACETAMINOPHEN 5-325 MG PO TABS
1.0000 | ORAL_TABLET | ORAL | Status: DC | PRN
  Administered 2013-05-25: 1 via ORAL
  Administered 2013-05-26 (×5): 2 via ORAL
  Administered 2013-05-27 (×4): 1 via ORAL
  Administered 2013-05-27: 2 via ORAL
  Administered 2013-05-28 (×2): 1 via ORAL
  Filled 2013-05-25 (×3): qty 2
  Filled 2013-05-25: qty 1
  Filled 2013-05-25: qty 2
  Filled 2013-05-25 (×3): qty 1
  Filled 2013-05-25: qty 2
  Filled 2013-05-25 (×3): qty 1
  Filled 2013-05-25: qty 2

## 2013-05-25 MED ORDER — DIBUCAINE 1 % RE OINT: 1.0000 "application " | TOPICAL_OINTMENT | RECTAL | Status: DC | PRN

## 2013-05-25 MED ORDER — OXYTOCIN 10 UNIT/ML IJ SOLN
INTRAMUSCULAR | Status: AC
  Filled 2013-05-25: qty 4

## 2013-05-25 MED ORDER — ONDANSETRON HCL 4 MG/2ML IJ SOLN: 4.0000 mg | INTRAMUSCULAR | Status: DC | PRN

## 2013-05-25 SURGICAL SUPPLY — 32 items
ADH SKN CLS APL DERMABOND .7 (GAUZE/BANDAGES/DRESSINGS) ×1
CLAMP CORD UMBIL (MISCELLANEOUS) IMPLANT
CLOTH BEACON ORANGE TIMEOUT ST (SAFETY) ×2 IMPLANT
DERMABOND ADVANCED (GAUZE/BANDAGES/DRESSINGS) ×1
DERMABOND ADVANCED .7 DNX12 (GAUZE/BANDAGES/DRESSINGS) ×1 IMPLANT
DRAPE LG THREE QUARTER DISP (DRAPES) ×2 IMPLANT
DRSG OPSITE POSTOP 4X10 (GAUZE/BANDAGES/DRESSINGS) ×2 IMPLANT
DURAPREP 26ML APPLICATOR (WOUND CARE) ×2 IMPLANT
ELECT REM PT RETURN 9FT ADLT (ELECTROSURGICAL) ×2
ELECTRODE REM PT RTRN 9FT ADLT (ELECTROSURGICAL) ×1 IMPLANT
EXTRACTOR VACUUM M CUP 4 TUBE (SUCTIONS) IMPLANT
GLOVE BIO SURGEON STRL SZ8.5 (GLOVE) ×2 IMPLANT
GOWN PREVENTION PLUS XXLARGE (GOWN DISPOSABLE) ×2 IMPLANT
GOWN STRL REIN XL XLG (GOWN DISPOSABLE) ×4 IMPLANT
KIT ABG SYR 3ML LUER SLIP (SYRINGE) IMPLANT
NDL HYPO 25X5/8 SAFETYGLIDE (NEEDLE) ×1 IMPLANT
NEEDLE HYPO 25X5/8 SAFETYGLIDE (NEEDLE) ×2 IMPLANT
NS IRRIG 1000ML POUR BTL (IV SOLUTION) ×2 IMPLANT
PACK C SECTION WH (CUSTOM PROCEDURE TRAY) ×2 IMPLANT
PAD OB MATERNITY 4.3X12.25 (PERSONAL CARE ITEMS) ×2 IMPLANT
SUT CHROMIC 0 CT 802H (SUTURE) ×2 IMPLANT
SUT CHROMIC 1 CTX 36 (SUTURE) ×4 IMPLANT
SUT CHROMIC 2 0 SH (SUTURE) ×2 IMPLANT
SUT GUT PLAIN 0 CT-3 TAN 27 (SUTURE) IMPLANT
SUT MON AB 4-0 PS1 27 (SUTURE) ×2 IMPLANT
SUT VIC AB 0 CT1 18XCR BRD8 (SUTURE) IMPLANT
SUT VIC AB 0 CT1 8-18 (SUTURE)
SUT VIC AB 0 CTX 36 (SUTURE) ×4
SUT VIC AB 0 CTX36XBRD ANBCTRL (SUTURE) ×2 IMPLANT
TOWEL OR 17X24 6PK STRL BLUE (TOWEL DISPOSABLE) ×2 IMPLANT
TRAY FOLEY CATH 14FR (SET/KITS/TRAYS/PACK) ×2 IMPLANT
WATER STERILE IRR 1000ML POUR (IV SOLUTION) ×2 IMPLANT

## 2013-05-25 NOTE — Addendum Note (Signed)
Addendum created 05/25/13 1610 by Dana Allan, MD   Modules edited: Anesthesia Review and Sign Navigator Section

## 2013-05-25 NOTE — Progress Notes (Signed)
OR called for pt. Too pt to OR suit. Met by circulator.Dr.  Jasmine December needed CBC and type screen results prior to starting case. CBC not ordered by MD. Pt taken back to MAU to wait for results.

## 2013-05-25 NOTE — Anesthesia Postprocedure Evaluation (Signed)
  Anesthesia Post-op Note  Patient: Kristine Beard  Procedure(s) Performed: Procedure(s): CESAREAN SECTION (N/A)  Patient Location: Mother/Baby  Anesthesia Type:Spinal  Level of Consciousness: awake, alert  and oriented  Airway and Oxygen Therapy: Patient Spontanous Breathing  Post-op Pain: mild  Post-op Assessment: Patient's Cardiovascular Status Stable, Respiratory Function Stable, Patent Airway, No signs of Nausea or vomiting, Pain level controlled, No headache, No backache, No residual numbness and No residual motor weakness  Post-op Vital Signs: stable  Complications: No apparent anesthesia complications

## 2013-05-25 NOTE — Anesthesia Postprocedure Evaluation (Signed)
  Anesthesia Post-op Note  Anesthesia Post Note  Patient: Kristine Beard  Procedure(s) Performed: Procedure(s) (LRB): CESAREAN SECTION (N/A)  Anesthesia type: Spinal  Patient location: PACU  Post pain: Pain level controlled  Post assessment: Post-op Vital signs reviewed  Last Vitals:  Filed Vitals:   05/25/13 0645  BP:   Pulse: 67  Temp:   Resp: 28    Post vital signs: Reviewed  Level of consciousness: awake  Complications: No apparent anesthesia complications

## 2013-05-25 NOTE — Anesthesia Procedure Notes (Signed)
Spinal  Patient location during procedure: OR Start time: 05/25/2013 5:04 AM Staffing Performed by: anesthesiologist  Preanesthetic Checklist Completed: patient identified, site marked, surgical consent, pre-op evaluation, timeout performed, IV checked, risks and benefits discussed and monitors and equipment checked Spinal Block Patient position: sitting Prep: site prepped and draped and DuraPrep Patient monitoring: heart rate, cardiac monitor, continuous pulse ox and blood pressure Approach: midline Location: L3-4 Injection technique: single-shot Needle Needle type: Sprotte  Needle gauge: 24 G Needle length: 9 cm Assessment Sensory level: T4 Additional Notes Clear free flow CSF on first attempt.  No paresthesia.  Patient tolerated procedure well with no apparent complications.  Jasmine December, MD

## 2013-05-25 NOTE — MAU Note (Signed)
PT SAYS SHE HAS BEEN HURTING SINCE 930PM-  IS A SCH REPEAT    C/S FOR  9-4.     VE IN OFFICE  DIDN'T TELL HER.    DENIES HSV AND MRSA.

## 2013-05-25 NOTE — Op Note (Signed)
P. preop diagnosis previous cesarean section at term and previous myomectomy not Postop diagnosis repeat low transverse cesarean section although patient in labor Surgeon Dr. Francoise Ceo Anesthesia spinal Procedure after the spinal administered patient in the supine position abdomen prepped and draped  Bladder emptied  with a Foley catheter a transverse suprapubic incision made carried down to the rectus fascia fascia cleaned and incised the length of the incision recti muscles retracted laterally peritoneum incised longitudinally transverse incision made on the visceroperitoneum above the bladder bladder mobilized inferiorly transverse low uterine incision made the fluid was clear patient delivered from a female Apgar 8 and 9 the placenta was fundal removed manually and uterine cavity clean with dry laps uterine incision was closed in 2 layers with continuous suture of #1 chromic lap and sponge counts correct uterus well contracted tubes and ovaries normal abdomen closed in layers peritoneum continuous with 2-0 chromic fascia continuous within of 0 Dexon skin shows a subcuticular stitch of 4-0 Monocryl blood loss 500 cc patient tolerated procedure well

## 2013-05-25 NOTE — H&P (Signed)
Cystoscopy rigid of dictation patient has all gone into labor and she is scheduled for repeat C-section Thursday because of previous C-section and previous myomectomy so that the pseudomonal done at this time and and

## 2013-05-25 NOTE — Anesthesia Preprocedure Evaluation (Addendum)
Anesthesia Evaluation  Patient identified by MRN, date of birth, ID band Patient awake    Reviewed: Allergy & Precautions, H&P , NPO status , Patient's Chart, lab work & pertinent test results, reviewed documented beta blocker date and time   History of Anesthesia Complications Negative for: history of anesthetic complications  Airway Mallampati: II TM Distance: >3 FB Neck ROM: full    Dental  (+) Teeth Intact   Pulmonary neg pulmonary ROS,  breath sounds clear to auscultation  Pulmonary exam normal       Cardiovascular negative cardio ROS  Rhythm:regular Rate:Normal     Neuro/Psych negative neurological ROS  negative psych ROS   GI/Hepatic negative GI ROS, Neg liver ROS,   Endo/Other  negative endocrine ROS  Renal/GU negative Renal ROS  negative genitourinary   Musculoskeletal   Abdominal Normal abdominal exam  (+)   Peds  Hematology negative hematology ROS (+)   Anesthesia Other Findings NPO since 1830  Reproductive/Obstetrics (+) Pregnancy (h/o c/s x1 and myomectomies, in labor, for repeat c/s)                          Anesthesia Physical Anesthesia Plan  ASA: II and emergent  Anesthesia Plan: Spinal   Post-op Pain Management:    Induction:   Airway Management Planned:   Additional Equipment:   Intra-op Plan:   Post-operative Plan:   Informed Consent: I have reviewed the patients History and Physical, chart, labs and discussed the procedure including the risks, benefits and alternatives for the proposed anesthesia with the patient or authorized representative who has indicated his/her understanding and acceptance.     Plan Discussed with: Surgeon and CRNA  Anesthesia Plan Comments:         Anesthesia Quick Evaluation

## 2013-05-25 NOTE — Transfer of Care (Signed)
Immediate Anesthesia Transfer of Care Note  Patient: Kristine Beard  Procedure(s) Performed: Procedure(s): CESAREAN SECTION (N/A)  Patient Location: PACU  Anesthesia Type:Spinal  Level of Consciousness: awake  Airway & Oxygen Therapy: Patient Spontanous Breathing  Post-op Assessment: Report given to PACU RN and Post -op Vital signs reviewed and stable  Post vital signs: stable  Complications: No apparent anesthesia complications

## 2013-05-26 DIAGNOSIS — IMO0002 Reserved for concepts with insufficient information to code with codable children: Secondary | ICD-10-CM | POA: Diagnosis not present

## 2013-05-26 DIAGNOSIS — Z98891 History of uterine scar from previous surgery: Secondary | ICD-10-CM | POA: Diagnosis not present

## 2013-05-26 LAB — CBC
MCH: 30.3 pg (ref 26.0–34.0)
MCV: 88.3 fL (ref 78.0–100.0)
Platelets: 162 10*3/uL (ref 150–400)
RBC: 3.17 MIL/uL — ABNORMAL LOW (ref 3.87–5.11)
RDW: 13.9 % (ref 11.5–15.5)
WBC: 9.9 10*3/uL (ref 4.0–10.5)

## 2013-05-26 NOTE — Progress Notes (Signed)
Patient ID: Kristine Beard, female   DOB: [birth year], 37 y.o.   MRN: [id number] Subjective: POD# 1 s/p Cesarean Delivery.  Indications: previous myomectomy  RH status/Rubella reviewed. Feeding: unknown Patient reports tolerating PO.  Denies HA/SOB/C/P/N/V/dizziness. Breast symptoms: no.  She reports vaginal bleeding as normal, without clots.  She is ambulating, urinating without difficulty.     Objective: Vital signs in last 24 hours: BP 135/76  Pulse 68  Temp(Src) 98.5 F (36.9 C) (Oral)  Resp 18  Ht 5\' 4"  (1.626 m)  Wt 189 lb 4 oz (85.843 kg)  BMI 32.47 kg/m2  SpO2 100%       Physical Exam:  General: alert CV: Regular rate and rhythm Resp: clear Abdomen: soft, nontender, normal bowel sounds Lochia: minimal Uterine Fundus: firm, below umbilicus, nontender Incision: dressing C/D Ext: extremities normal, atraumatic, no cyanosis or edema    Recent Labs  05/25/13 0240 05/26/13 0600  HGB 11.9* 9.7*  HCT 34.7* 28.0*      Assessment/Plan: 37 y.o.  status post Cesarean section. POD# 1.   Doing well, stable.              Advance diet as tolerated Start po pain meds D/C foley  HLIV  Ambulate IS Routine post-op care  JACKSON-MOORE,Kohler Pellerito A 05/26/2013, 10:18 AM

## 2013-05-27 NOTE — Progress Notes (Signed)
  Patient ID: Shardae Kleinman, female   DOB: Jan 06, 1976, 37 y.o.   MRN: [id number] Subjective: POD# 2 s/p Cesarean Delivery.  Indications: previous myomectomy  RH status/Rubella reviewed. Feeding: unknown Patient reports tolerating PO.  Denies HA/SOB/C/P/N/V/dizziness. Breast symptoms: no.  She reports vaginal bleeding as normal, without clots.  She is ambulating, urinating without difficulty.     Objective: Vital signs in last 24 hours: BP 116/71  Pulse 73  Temp(Src) 98.3 F (36.8 C) (Oral)  Resp 18  Ht 5\' 4"  (1.626 m)  Wt 189 lb 4 oz (85.843 kg)  BMI 32.47 kg/m2  SpO2 100%       Physical Exam:  General: alert CV: Regular rate and rhythm Resp: clear Abdomen: soft, nontender, normal bowel sounds Lochia: minimal Uterine Fundus: firm, below umbilicus, nontender Incision: dressing C/D Ext: extremities normal, atraumatic, no cyanosis or edema    Assessment/Plan: 37 y.o.  status post Cesarean section. POD# 2.   Doing well, stable.               Ambulate IS Routine post-op care  JACKSON-MOORE,Lillyanna Glandon A 05/27/2013, 10:59 AM

## 2013-05-28 ENCOUNTER — Encounter (HOSPITAL_COMMUNITY): Payer: Self-pay | Admitting: Obstetrics

## 2013-05-28 MED ORDER — OXYCODONE-ACETAMINOPHEN 5-325 MG PO TABS: 1.0000 | ORAL_TABLET | ORAL | Status: DC | PRN

## 2013-05-28 MED ORDER — NORETHINDRONE 0.35 MG PO TABS: 1.0000 | ORAL_TABLET | Freq: Every day | ORAL | Status: DC

## 2013-05-28 MED ORDER — PRENATAL MULTIVITAMIN CH: 1.0000 | ORAL_TABLET | Freq: Every day | ORAL | Status: AC

## 2013-05-28 NOTE — Discharge Summary (Signed)
  Obstetric Discharge Summary Reason for Admission: cesarean section Prenatal Procedures: none Intrapartum Procedures: cesarean: low cervical, transverse Postpartum Procedures: none Complications-Operative and Postpartum: none  Hemoglobin  Date Value Range Status  05/26/2013 9.7* 12.0 - 15.0 g/dL Final     DELTA CHECK NOTED     REPEATED TO VERIFY     HCT  Date Value Range Status  05/26/2013 28.0* 36.0 - 46.0 % Final    Physical Exam:  General: alert Lochia: appropriate Uterine: firm Incision: clean, dry and intact DVT Evaluation: No evidence of DVT seen on physical exam.  Discharge Diagnoses: Active Problems:   Cesarean delivery delivered   Maternal anemia complicating pregnancy, childbirth, or the puerperium   Discharge Information: Date: 05/28/2013 Activity: pelvic rest Diet: routine Medications:  Prior to Admission medications   Medication Sig Start Date End Date Taking? Authorizing Provider  norethindrone (ORTHO MICRONOR) 0.35 MG tablet Take 1 tablet (0.35 mg total) by mouth daily. 05/28/13   Antionette Char, MD  oxyCODONE-acetaminophen (PERCOCET/ROXICET) 5-325 MG per tablet Take 1-2 tablets by mouth every 4 (four) hours as needed. 05/28/13   Antionette Char, MD  Prenatal Vit-Fe Fumarate-FA (PRENATAL MULTIVITAMIN) TABS tablet Take 1 tablet by mouth daily at 12 noon. 05/28/13   Antionette Char, MD    Condition: stable Instructions: refer to routine discharge instructions Discharge to: home Follow-up Information   Schedule an appointment as soon as possible for a visit with Kathreen Cosier, MD.   Specialty:  Obstetrics and Gynecology   Contact information:   [address] [place] [address] [phone]       Newborn Data:  Live born female  Birth Weight: 6 lb 5.8 oz (2885 g) APGAR: 8, 9   Home with mother.  JACKSON-MOORE,Jerrel Tiberio A 05/28/2013, 10:31 AM

## 2013-05-28 NOTE — Progress Notes (Signed)
Discussed discharge instructions with pt, verbalized understanding. 

## 2013-05-29 ENCOUNTER — Inpatient Hospital Stay (HOSPITAL_COMMUNITY): Admission: RE | Admit: 2013-05-29 | Payer: Self-pay | Source: Ambulatory Visit

## 2013-05-31 ENCOUNTER — Inpatient Hospital Stay (HOSPITAL_COMMUNITY): Admission: RE | Admit: 2013-05-31 | Payer: Medicaid Other | Source: Ambulatory Visit | Admitting: Obstetrics

## 2013-05-31 SURGERY — Surgical Case
Anesthesia: Regional

## 2014-07-29 ENCOUNTER — Encounter (HOSPITAL_COMMUNITY): Payer: Self-pay | Admitting: Obstetrics

## 2015-11-10 ENCOUNTER — Encounter: Payer: Self-pay | Admitting: Obstetrics & Gynecology

## 2015-11-10 ENCOUNTER — Ambulatory Visit (INDEPENDENT_AMBULATORY_CARE_PROVIDER_SITE_OTHER): Payer: Self-pay | Admitting: Obstetrics & Gynecology

## 2015-11-10 VITALS — BP 126/79 | HR 85 | Wt 163.0 lb

## 2015-11-10 DIAGNOSIS — Z3481 Encounter for supervision of other normal pregnancy, first trimester: Secondary | ICD-10-CM

## 2015-11-10 DIAGNOSIS — Z124 Encounter for screening for malignant neoplasm of cervix: Secondary | ICD-10-CM

## 2015-11-10 DIAGNOSIS — Z1151 Encounter for screening for human papillomavirus (HPV): Secondary | ICD-10-CM

## 2015-11-10 DIAGNOSIS — Z3480 Encounter for supervision of other normal pregnancy, unspecified trimester: Secondary | ICD-10-CM | POA: Insufficient documentation

## 2015-11-10 DIAGNOSIS — O09521 Supervision of elderly multigravida, first trimester: Secondary | ICD-10-CM

## 2015-11-10 DIAGNOSIS — Z113 Encounter for screening for infections with a predominantly sexual mode of transmission: Secondary | ICD-10-CM

## 2015-11-10 DIAGNOSIS — O09529 Supervision of elderly multigravida, unspecified trimester: Secondary | ICD-10-CM

## 2015-11-10 NOTE — Progress Notes (Signed)
Subjective:    Kristine Beard is a AA [id number] [redacted]w[redacted]d being seen today for her first obstetrical visit.  Her obstetrical history is significant for advanced maternal age and previous myomectomy and 2 previous cesarean sections.. Patient does intend to breast feed. Pregnancy history fully reviewed.  Patient reports no complaints.  Filed Vitals:   11/10/15 1347  BP: 126/79  Pulse: 85  Weight: 163 lb (73.936 kg)    HISTORY: OB History  Gravida Para Term Preterm AB SAB TAB Ectopic Multiple Living  3 2 2       2     # Outcome Date GA Lbr Len/2nd Weight Sex Delivery Anes PTL Lv  3 Current           2 Term 05/25/13 [redacted]w[redacted]d  6 lb 5.8 oz (2.885 kg) M CS-LTranv Spinal  Y  1 Term     M CS-LTranv   Y     Past Medical History  Diagnosis Date  . Fibroid, uterine   . Endometriosis    Past Surgical History  Procedure Laterality Date  . Cesarean section    . Myomectomy    . Cesarean section N/A 05/25/2013    Procedure: CESAREAN SECTION;  Surgeon: Frederico Hamman, MD;  Location: [facility];  Service: Obstetrics;  Laterality: N/A;   History reviewed. No pertinent family history.   Exam    Uterus:     Pelvic Exam:    Perineum: No Hemorrhoids   Vulva: normal   Vagina:  normal mucosa   pH:    Cervix: anteverted, there is a small polyp noted   Adnexa: normal adnexa   Bony Pelvis: android  System: Breast:  normal appearance, no masses or tenderness   Skin: normal coloration and turgor, no rashes    Neurologic: oriented   Extremities: normal strength, tone, and muscle mass   HEENT PERRLA   Mouth/Teeth mucous membranes moist, pharynx normal without lesions   Neck supple   Cardiovascular: regular rate and rhythm   Respiratory:  appears well, vitals normal, no respiratory distress, acyanotic, normal RR, ear and throat exam is normal, neck free of mass or lymphadenopathy, chest clear, no wheezing, crepitations, rhonchi, normal symmetric air entry   Abdomen: soft, non-tender; bowel sounds  normal; no masses,  no organomegaly   Urinary: urethral meatus normal      Assessment:    Pregnancy: [id number] Patient Active Problem List   Diagnosis Date Noted  . Supervision of other normal pregnancy, antepartum 11/10/2015  . AMA (advanced maternal age) multigravida 35+ 11/10/2015  . Cesarean delivery delivered 05/26/2013  . Maternal anemia complicating pregnancy, childbirth, or the puerperium 05/26/2013        Plan:     Initial labs drawn. Prenatal vitamins. Problem list reviewed and updated. Genetic Screening discussed First Screen: declined.  Ultrasound discussed; fetal survey: requested.  Follow up in 4 weeks. Flu vaccine today  Annalaya Wile C. 11/10/2015   Subjective:    Kristine Beard is a [id number] AA  [redacted]w[redacted]d being seen today for her first obstetrical visit.  Her obstetrical history is significant for advanced maternal age. Patient does not intend to breast feed. Pregnancy history fully reviewed.  Patient reports no complaints.  Filed Vitals:   11/10/15 1347  BP: 126/79  Pulse: 85  Weight: 163 lb (73.936 kg)    HISTORY: OB History  Gravida Para Term Preterm AB SAB TAB Ectopic Multiple Living  3 2 2       2     # Outcome  Date GA Lbr Len/2nd Weight Sex Delivery Anes PTL Lv  3 Current           2 Term 05/25/13 [redacted]w[redacted]d  6 lb 5.8 oz (2.885 kg) M CS-LTranv Spinal  Y  1 Term     M CS-LTranv   Y     Past Medical History  Diagnosis Date  . Fibroid, uterine   . Endometriosis    Past Surgical History  Procedure Laterality Date  . Cesarean section    . Myomectomy    . Cesarean section N/A 05/25/2013    Procedure: CESAREAN SECTION;  Surgeon: Frederico Hamman, MD;  Location: [facility];  Service: Obstetrics;  Laterality: N/A;   History reviewed. No pertinent family history.   Exam    Uterus:     Pelvic Exam:    Perineum: No Hemorrhoids   Vulva: normal   Vagina:  normal mucosa   pH:    Cervix: anteverted   Adnexa: normal adnexa   Bony Pelvis: android   System: Breast:  normal appearance, no masses or tenderness   Skin: normal coloration and turgor, no rashes    Neurologic: oriented   Extremities: normal strength, tone, and muscle mass   HEENT PERRLA   Mouth/Teeth mucous membranes moist, pharynx normal without lesions   Neck supple   Cardiovascular: regular rate and rhythm   Respiratory:  appears well, vitals normal, no respiratory distress, acyanotic, normal RR, ear and throat exam is normal, neck free of mass or lymphadenopathy, chest clear, no wheezing, crepitations, rhonchi, normal symmetric air entry   Abdomen: soft, non-tender; bowel sounds normal; no masses,  no organomegaly   Urinary: urethral meatus normal      Assessment:    Pregnancy: [id number] Patient Active Problem List   Diagnosis Date Noted  . Supervision of other normal pregnancy, antepartum 11/10/2015  . AMA (advanced maternal age) multigravida 35+ 11/10/2015  . Cesarean delivery delivered 05/26/2013  . Maternal anemia complicating pregnancy, childbirth, or the puerperium 05/26/2013        Plan:     Initial labs drawn. Prenatal vitamins. Problem list reviewed and updated. Genetic Screening discussed and declined.  Ultrasound discussed; fetal survey: requested.  Follow up in 4 weeks. Flu vaccine today  Ival Basquez C. 11/12/2015

## 2015-11-10 NOTE — Progress Notes (Signed)
Bedside ultrasound today measures [redacted]w[redacted]d fetus with heartbeat.

## 2015-11-11 LAB — PRENATAL PROFILE (SOLSTAS)
Antibody Screen: NEGATIVE
BASOS PCT: 0 % (ref 0–1)
Basophils Absolute: 0 10*3/uL (ref 0.0–0.1)
Eosinophils Absolute: 0.1 10*3/uL (ref 0.0–0.7)
Eosinophils Relative: 2 % (ref 0–5)
HCT: 33.5 % — ABNORMAL LOW (ref 36.0–46.0)
HEMOGLOBIN: 11.3 g/dL — AB (ref 12.0–15.0)
HEP B S AG: NEGATIVE
HIV 1&2 Ab, 4th Generation: NONREACTIVE
LYMPHS ABS: 1.7 10*3/uL (ref 0.7–4.0)
LYMPHS PCT: 28 % (ref 12–46)
MCH: 30.6 pg (ref 26.0–34.0)
MCHC: 33.7 g/dL (ref 30.0–36.0)
MCV: 90.8 fL (ref 78.0–100.0)
MONOS PCT: 9 % (ref 3–12)
MPV: 9.6 fL (ref 8.6–12.4)
Monocytes Absolute: 0.5 10*3/uL (ref 0.1–1.0)
NEUTROS ABS: 3.6 10*3/uL (ref 1.7–7.7)
NEUTROS PCT: 61 % (ref 43–77)
Platelets: 259 10*3/uL (ref 150–400)
RBC: 3.69 MIL/uL — ABNORMAL LOW (ref 3.87–5.11)
RDW: 14.8 % (ref 11.5–15.5)
RUBELLA: 15.6 {index} — AB (ref <0.90)
Rh Type: POSITIVE
WBC: 5.9 10*3/uL (ref 4.0–10.5)

## 2015-11-11 LAB — CULTURE, OB URINE
COLONY COUNT: NO GROWTH
ORGANISM ID, BACTERIA: NO GROWTH

## 2015-11-11 LAB — CYTOLOGY - PAP

## 2015-11-12 LAB — HEMOGLOBINOPATHY EVALUATION
HGB A: 95.8 % — AB (ref 96.8–97.8)
Hemoglobin Other: 0 %
Hgb A2 Quant: 3 % (ref 2.2–3.2)
Hgb F Quant: 1.2 % (ref 0.0–2.0)
Hgb S Quant: 0 %

## 2015-12-08 ENCOUNTER — Ambulatory Visit (INDEPENDENT_AMBULATORY_CARE_PROVIDER_SITE_OTHER): Payer: Self-pay | Admitting: Family Medicine

## 2015-12-08 VITALS — BP 117/70 | HR 84 | Wt 163.0 lb

## 2015-12-08 DIAGNOSIS — O09522 Supervision of elderly multigravida, second trimester: Secondary | ICD-10-CM

## 2015-12-08 DIAGNOSIS — O34219 Maternal care for unspecified type scar from previous cesarean delivery: Secondary | ICD-10-CM

## 2015-12-08 DIAGNOSIS — Z3482 Encounter for supervision of other normal pregnancy, second trimester: Secondary | ICD-10-CM

## 2015-12-08 DIAGNOSIS — Z3480 Encounter for supervision of other normal pregnancy, unspecified trimester: Secondary | ICD-10-CM

## 2015-12-08 NOTE — Progress Notes (Signed)
Subjective:  Kamea Gatz is a 40 y.o. G3P2002 at [redacted]w[redacted]d being seen today for ongoing prenatal care.  She is currently monitored for the following issues for this low-risk pregnancy and has Previous cesarean delivery, antepartum; Maternal anemia complicating pregnancy, childbirth, or the puerperium; Supervision of other normal pregnancy, antepartum; and AMA (advanced maternal age) multigravida 35+ on her problem list.  Patient reports no complaints.  Contractions: Not present. Vag. Bleeding: None.  Movement: Absent. Denies leaking of fluid.   The following portions of the patient's history were reviewed and updated as appropriate: allergies, current medications, past family history, past medical history, past social history, past surgical history and problem list. Problem list updated.  Objective:   Filed Vitals:   12/08/15 1532  BP: 117/70  Pulse: 84  Weight: 163 lb (73.936 kg)    Fetal Status: Fetal Heart Rate (bpm): 141   Movement: Absent     General:  Alert, oriented and cooperative. Patient is in no acute distress.  Skin: Skin is warm and dry. No rash noted.   Cardiovascular: Normal heart rate noted  Respiratory: Normal respiratory effort, no problems with respiration noted  Abdomen: Soft, gravid, appropriate for gestational age. Pain/Pressure: Present     Pelvic: Vag. Bleeding: None Vag D/C Character: Thin   Cervical exam deferred        Extremities: Normal range of motion.  Edema: None  Mental Status: Normal mood and affect. Normal behavior. Normal judgment and thought content.   Urinalysis: Urine Protein: Trace Urine Glucose: Negative  Assessment and Plan:  Pregnancy: G3P2002 at [redacted]w[redacted]d  1. Supervision of other normal pregnancy, antepartum, unspecified trimester Anatomy u/s at [facility] office. - US OB Comp + 14 Wk; Future  2. AMA (advanced maternal age) multigravida 34+, second trimester Declines genetics  3. Previous cesarean delivery, antepartum Would like BTL with  C-section - discussed bill  General obstetric precautions were reviewed in detail with the patient. Please refer to After Visit Summary for other counseling recommendations.  Return in 4 weeks (on 01/05/2016).   Donnamae Jude, MD

## 2015-12-08 NOTE — Patient Instructions (Signed)
Second Trimester of Pregnancy The second trimester is from week 13 through week 28, months 4 through 6. The second trimester is often a time when you feel your best. Your body has also adjusted to being pregnant, and you begin to feel better physically. Usually, morning sickness has lessened or quit completely, you may have more energy, and you may have an increase in appetite. The second trimester is also a time when the fetus is growing rapidly. At the end of the sixth month, the fetus is about 9 inches long and weighs about 1 pounds. You will likely begin to feel the baby move (quickening) between 18 and 20 weeks of the pregnancy. BODY CHANGES Your body goes through many changes during pregnancy. The changes vary from woman to woman.   Your weight will continue to increase. You will notice your lower abdomen bulging out.  You may begin to get stretch marks on your hips, abdomen, and breasts.  You may develop headaches that can be relieved by medicines approved by your health care provider.  You may urinate more often because the fetus is pressing on your bladder.  You may develop or continue to have heartburn as a result of your pregnancy.  You may develop constipation because certain hormones are causing the muscles that push waste through your intestines to slow down.  You may develop hemorrhoids or swollen, bulging veins (varicose veins).  You may have back pain because of the weight gain and pregnancy hormones relaxing your joints between the bones in your pelvis and as a result of a shift in weight and the muscles that support your balance.  Your breasts will continue to grow and be tender.  Your gums may bleed and may be sensitive to brushing and flossing.  Dark spots or blotches (chloasma, mask of pregnancy) may develop on your face. This will likely fade after the baby is born.  A dark line from your belly button to the pubic area (linea nigra) may appear. This will likely  fade after the baby is born.  You may have changes in your hair. These can include thickening of your hair, rapid growth, and changes in texture. Some women also have hair loss during or after pregnancy, or hair that feels dry or thin. Your hair will most likely return to normal after your baby is born. WHAT TO EXPECT AT YOUR PRENATAL VISITS During a routine prenatal visit:  You will be weighed to make sure you and the fetus are growing normally.  Your blood pressure will be taken.  Your abdomen will be measured to track your baby's growth.  The fetal heartbeat will be listened to.  Any test results from the previous visit will be discussed. Your health care provider may ask you:  How you are feeling.  If you are feeling the baby move.  If you have had any abnormal symptoms, such as leaking fluid, bleeding, severe headaches, or abdominal cramping.  If you are using any tobacco products, including cigarettes, chewing tobacco, and electronic cigarettes.  If you have any questions. Other tests that may be performed during your second trimester include:  Blood tests that check for:  Low iron levels (anemia).  Gestational diabetes (between 24 and 28 weeks).  Rh antibodies.  Urine tests to check for infections, diabetes, or protein in the urine.  An ultrasound to confirm the proper growth and development of the baby.  An amniocentesis to check for possible genetic problems.  Fetal screens for spina bifida   and Down syndrome.  HIV (human immunodeficiency virus) testing. Routine prenatal testing includes screening for HIV, unless you choose not to have this test. HOME CARE INSTRUCTIONS   Avoid all smoking, herbs, alcohol, and unprescribed drugs. These chemicals affect the formation and growth of the baby.  Do not use any tobacco products, including cigarettes, chewing tobacco, and electronic cigarettes. If you need help quitting, ask your health care provider. You may receive  counseling support and other resources to help you quit.  Follow your health care provider's instructions regarding medicine use. There are medicines that are either safe or unsafe to take during pregnancy.  Exercise only as directed by your health care provider. Experiencing uterine cramps is a good sign to stop exercising.  Continue to eat regular, healthy meals.  Wear a good support bra for breast tenderness.  Do not use hot tubs, steam rooms, or saunas.  Wear your seat belt at all times when driving.  Avoid raw meat, uncooked cheese, cat litter boxes, and soil used by cats. These carry germs that can cause birth defects in the baby.  Take your prenatal vitamins.  Take 1500-2000 mg of calcium daily starting at the 20th week of pregnancy until you deliver your baby.  Try taking a stool softener (if your health care provider approves) if you develop constipation. Eat more high-fiber foods, such as fresh vegetables or fruit and whole grains. Drink plenty of fluids to keep your urine clear or pale yellow.  Take warm sitz baths to soothe any pain or discomfort caused by hemorrhoids. Use hemorrhoid cream if your health care provider approves.  If you develop varicose veins, wear support hose. Elevate your feet for 15 minutes, 3-4 times a day. Limit salt in your diet.  Avoid heavy lifting, wear low heel shoes, and practice good posture.  Rest with your legs elevated if you have leg cramps or low back pain.  Visit your dentist if you have not gone yet during your pregnancy. Use a soft toothbrush to brush your teeth and be gentle when you floss.  A sexual relationship may be continued unless your health care provider directs you otherwise.  Continue to go to all your prenatal visits as directed by your health care provider. SEEK MEDICAL CARE IF:   You have dizziness.  You have mild pelvic cramps, pelvic pressure, or nagging pain in the abdominal area.  You have persistent nausea,  vomiting, or diarrhea.  You have a bad smelling vaginal discharge.  You have pain with urination. SEEK IMMEDIATE MEDICAL CARE IF:   You have a fever.  You are leaking fluid from your vagina.  You have spotting or bleeding from your vagina.  You have severe abdominal cramping or pain.  You have rapid weight gain or loss.  You have shortness of breath with chest pain.  You notice sudden or extreme swelling of your face, hands, ankles, feet, or legs.  You have not felt your baby move in over an hour.  You have severe headaches that do not go away with medicine.  You have vision changes.   This information is not intended to replace advice given to you by your health care provider. Make sure you discuss any questions you have with your health care provider.   Document Released: 09/07/2001 Document Revised: 10/04/2014 Document Reviewed: 11/14/2012 [employer] Interactive Patient Education 2016 [employer]   Breastfeeding Deciding to breastfeed is one of the best choices you can make for you and your baby. A change   in hormones during pregnancy causes your breast tissue to grow and increases the number and size of your milk ducts. These hormones also allow proteins, sugars, and fats from your blood supply to make breast milk in your milk-producing glands. Hormones prevent breast milk from being released before your baby is born as well as prompt milk flow after birth. Once breastfeeding has begun, thoughts of your baby, as well as his or her sucking or crying, can stimulate the release of milk from your milk-producing glands.  BENEFITS OF BREASTFEEDING For Your Baby  Your first milk (colostrum) helps your baby's digestive system function better.  There are antibodies in your milk that help your baby fight off infections.  Your baby has a lower incidence of asthma, allergies, and sudden infant death syndrome.  The nutrients in breast milk are better for your baby than infant  formulas and are designed uniquely for your baby's needs.  Breast milk improves your baby's brain development.  Your baby is less likely to develop other conditions, such as childhood obesity, asthma, or type 2 diabetes mellitus. For You  Breastfeeding helps to create a very special bond between you and your baby.  Breastfeeding is convenient. Breast milk is always available at the correct temperature and costs nothing.  Breastfeeding helps to burn calories and helps you lose the weight gained during pregnancy.  Breastfeeding makes your uterus contract to its prepregnancy size faster and slows bleeding (lochia) after you give birth.   Breastfeeding helps to lower your risk of developing type 2 diabetes mellitus, osteoporosis, and breast or ovarian cancer later in life. SIGNS THAT YOUR BABY IS HUNGRY Early Signs of Hunger  Increased alertness or activity.  Stretching.  Movement of the head from side to side.  Movement of the head and opening of the mouth when the corner of the mouth or cheek is stroked (rooting).  Increased sucking sounds, smacking lips, cooing, sighing, or squeaking.  Hand-to-mouth movements.  Increased sucking of fingers or hands. Late Signs of Hunger  Fussing.  Intermittent crying. Extreme Signs of Hunger Signs of extreme hunger will require calming and consoling before your baby will be able to breastfeed successfully. Do not wait for the following signs of extreme hunger to occur before you initiate breastfeeding:  Restlessness.  A loud, strong cry.  Screaming. BREASTFEEDING BASICS Breastfeeding Initiation  Find a comfortable place to sit or lie down, with your neck and back well supported.  Place a pillow or rolled up blanket under your baby to bring him or her to the level of your breast (if you are seated). Nursing pillows are specially designed to help support your arms and your baby while you breastfeed.  Make sure that your baby's  abdomen is facing your abdomen.  Gently massage your breast. With your fingertips, massage from your chest wall toward your nipple in a circular motion. This encourages milk flow. You may need to continue this action during the feeding if your milk flows slowly.  Support your breast with 4 fingers underneath and your thumb above your nipple. Make sure your fingers are well away from your nipple and your baby's mouth.  Stroke your baby's lips gently with your finger or nipple.  When your baby's mouth is open wide enough, quickly bring your baby to your breast, placing your entire nipple and as much of the colored area around your nipple (areola) as possible into your baby's mouth.  More areola should be visible above your baby's upper lip than   below the lower lip.  Your baby's tongue should be between his or her lower gum and your breast.  Ensure that your baby's mouth is correctly positioned around your nipple (latched). Your baby's lips should create a seal on your breast and be turned out (everted).  It is common for your baby to suck about 2-3 minutes in order to start the flow of breast milk. Latching Teaching your baby how to latch on to your breast properly is very important. An improper latch can cause nipple pain and decreased milk supply for you and poor weight gain in your baby. Also, if your baby is not latched onto your nipple properly, he or she may swallow some air during feeding. This can make your baby fussy. Burping your baby when you switch breasts during the feeding can help to get rid of the air. However, teaching your baby to latch on properly is still the best way to prevent fussiness from swallowing air while breastfeeding. Signs that your baby has successfully latched on to your nipple:  Silent tugging or silent sucking, without causing you pain.  Swallowing heard between every 3-4 sucks.  Muscle movement above and in front of his or her ears while sucking. Signs  that your baby has not successfully latched on to nipple:  Sucking sounds or smacking sounds from your baby while breastfeeding.  Nipple pain. If you think your baby has not latched on correctly, slip your finger into the corner of your baby's mouth to break the suction and place it between your baby's gums. Attempt breastfeeding initiation again. Signs of Successful Breastfeeding Signs from your baby:  A gradual decrease in the number of sucks or complete cessation of sucking.  Falling asleep.  Relaxation of his or her body.  Retention of a small amount of milk in his or her mouth.  Letting go of your breast by himself or herself. Signs from you:  Breasts that have increased in firmness, weight, and size 1-3 hours after feeding.  Breasts that are softer immediately after breastfeeding.  Increased milk volume, as well as a change in milk consistency and color by the fifth day of breastfeeding.  Nipples that are not sore, cracked, or bleeding. Signs That Your Baby is Getting Enough Milk  Wetting at least 3 diapers in a 24-hour period. The urine should be clear and pale yellow by age 5 days.  At least 3 stools in a 24-hour period by age 5 days. The stool should be soft and yellow.  At least 3 stools in a 24-hour period by age 7 days. The stool should be seedy and yellow.  No loss of weight greater than 10% of birth weight during the first 3 days of age.  Average weight gain of 4-7 ounces (113-198 g) per week after age 4 days.  Consistent daily weight gain by age 5 days, without weight loss after the age of 2 weeks. After a feeding, your baby may spit up a small amount. This is common. BREASTFEEDING FREQUENCY AND DURATION Frequent feeding will help you make more milk and can prevent sore nipples and breast engorgement. Breastfeed when you feel the need to reduce the fullness of your breasts or when your baby shows signs of hunger. This is called "breastfeeding on demand." Avoid  introducing a pacifier to your baby while you are working to establish breastfeeding (the first 4-6 weeks after your baby is born). After this time you may choose to use a pacifier. Research has shown that   pacifier use during the first year of a baby's life decreases the risk of sudden infant death syndrome (SIDS). Allow your baby to feed on each breast as long as he or she wants. Breastfeed until your baby is finished feeding. When your baby unlatches or falls asleep while feeding from the first breast, offer the second breast. Because newborns are often sleepy in the first few weeks of life, you may need to awaken your baby to get him or her to feed. Breastfeeding times will vary from baby to baby. However, the following rules can serve as a guide to help you ensure that your baby is properly fed:  Newborns (babies 4 weeks of age or younger) may breastfeed every 1-3 hours.  Newborns should not go longer than 3 hours during the day or 5 hours during the night without breastfeeding.  You should breastfeed your baby a minimum of 8 times in a 24-hour period until you begin to introduce solid foods to your baby at around 6 months of age. BREAST MILK PUMPING Pumping and storing breast milk allows you to ensure that your baby is exclusively fed your breast milk, even at times when you are unable to breastfeed. This is especially important if you are going back to work while you are still breastfeeding or when you are not able to be present during feedings. Your lactation consultant can give you guidelines on how long it is safe to store breast milk. A breast pump is a machine that allows you to pump milk from your breast into a sterile bottle. The pumped breast milk can then be stored in a refrigerator or freezer. Some breast pumps are operated by hand, while others use electricity. Ask your lactation consultant which type will work best for you. Breast pumps can be purchased, but some hospitals and  breastfeeding support groups lease breast pumps on a monthly basis. A lactation consultant can teach you how to hand express breast milk, if you prefer not to use a pump. CARING FOR YOUR BREASTS WHILE YOU BREASTFEED Nipples can become dry, cracked, and sore while breastfeeding. The following recommendations can help keep your breasts moisturized and healthy:  Avoid using soap on your nipples.  Wear a supportive bra. Although not required, special nursing bras and tank tops are designed to allow access to your breasts for breastfeeding without taking off your entire bra or top. Avoid wearing underwire-style bras or extremely tight bras.  Air dry your nipples for 3-4minutes after each feeding.  Use only cotton bra pads to absorb leaked breast milk. Leaking of breast milk between feedings is normal.  Use lanolin on your nipples after breastfeeding. Lanolin helps to maintain your skin's normal moisture barrier. If you use pure lanolin, you do not need to wash it off before feeding your baby again. Pure lanolin is not toxic to your baby. You may also hand express a few drops of breast milk and gently massage that milk into your nipples and allow the milk to air dry. In the first few weeks after giving birth, some women experience extremely full breasts (engorgement). Engorgement can make your breasts feel heavy, warm, and tender to the touch. Engorgement peaks within 3-5 days after you give birth. The following recommendations can help ease engorgement:  Completely empty your breasts while breastfeeding or pumping. You may want to start by applying warm, moist heat (in the shower or with warm water-soaked hand towels) just before feeding or pumping. This increases circulation and helps the milk   flow. If your baby does not completely empty your breasts while breastfeeding, pump any extra milk after he or she is finished.  Wear a snug bra (nursing or regular) or tank top for 1-2 days to signal your body  to slightly decrease milk production.  Apply ice packs to your breasts, unless this is too uncomfortable for you.  Make sure that your baby is latched on and positioned properly while breastfeeding. If engorgement persists after 48 hours of following these recommendations, contact your health care provider or a lactation consultant. OVERALL HEALTH CARE RECOMMENDATIONS WHILE BREASTFEEDING  Eat healthy foods. Alternate between meals and snacks, eating 3 of each per day. Because what you eat affects your breast milk, some of the foods may make your baby more irritable than usual. Avoid eating these foods if you are sure that they are negatively affecting your baby.  Drink milk, fruit juice, and water to satisfy your thirst (about 10 glasses a day).  Rest often, relax, and continue to take your prenatal vitamins to prevent fatigue, stress, and anemia.  Continue breast self-awareness checks.  Avoid chewing and smoking tobacco. Chemicals from cigarettes that pass into breast milk and exposure to secondhand smoke may harm your baby.  Avoid alcohol and drug use, including marijuana. Some medicines that may be harmful to your baby can pass through breast milk. It is important to ask your health care provider before taking any medicine, including all over-the-counter and prescription medicine as well as vitamin and herbal supplements. It is possible to become pregnant while breastfeeding. If birth control is desired, ask your health care provider about options that will be safe for your baby. SEEK MEDICAL CARE IF:  You feel like you want to stop breastfeeding or have become frustrated with breastfeeding.  You have painful breasts or nipples.  Your nipples are cracked or bleeding.  Your breasts are red, tender, or warm.  You have a swollen area on either breast.  You have a fever or chills.  You have nausea or vomiting.  You have drainage other than breast milk from your nipples.  Your  breasts do not become full before feedings by the fifth day after you give birth.  You feel sad and depressed.  Your baby is too sleepy to eat well.  Your baby is having trouble sleeping.   Your baby is wetting less than 3 diapers in a 24-hour period.  Your baby has less than 3 stools in a 24-hour period.  Your baby's skin or the white part of his or her eyes becomes yellow.   Your baby is not gaining weight by 5 days of age. SEEK IMMEDIATE MEDICAL CARE IF:  Your baby is overly tired (lethargic) and does not want to wake up and feed.  Your baby develops an unexplained fever.   This information is not intended to replace advice given to you by your health care provider. Make sure you discuss any questions you have with your health care provider.   Document Released: 09/13/2005 Document Revised: 06/04/2015 Document Reviewed: 03/07/2013 [employer] Interactive Patient Education 2016 [employer]  

## 2016-01-05 ENCOUNTER — Ambulatory Visit (INDEPENDENT_AMBULATORY_CARE_PROVIDER_SITE_OTHER): Payer: Self-pay | Admitting: Family Medicine

## 2016-01-05 VITALS — BP 114/71 | HR 94 | Wt 171.0 lb

## 2016-01-05 DIAGNOSIS — O09522 Supervision of elderly multigravida, second trimester: Secondary | ICD-10-CM

## 2016-01-05 DIAGNOSIS — O219 Vomiting of pregnancy, unspecified: Secondary | ICD-10-CM

## 2016-01-05 DIAGNOSIS — O34219 Maternal care for unspecified type scar from previous cesarean delivery: Secondary | ICD-10-CM

## 2016-01-05 DIAGNOSIS — Z3482 Encounter for supervision of other normal pregnancy, second trimester: Secondary | ICD-10-CM

## 2016-01-05 MED ORDER — PROMETHAZINE HCL 25 MG PO TABS: 25.0000 mg | ORAL_TABLET | Freq: Four times a day (QID) | ORAL | Status: DC | PRN

## 2016-01-05 NOTE — Patient Instructions (Signed)
Third Trimester of Pregnancy The third trimester is from week 29 through week 42, months 7 through 9. The third trimester is a time when the fetus is growing rapidly. At the end of the ninth month, the fetus is about 20 inches in length and weighs 6-10 pounds.  BODY CHANGES Your body goes through many changes during pregnancy. The changes vary from woman to woman.   Your weight will continue to increase. You can expect to gain 25-35 pounds (11-16 kg) by the end of the pregnancy.  You may begin to get stretch marks on your hips, abdomen, and breasts.  You may urinate more often because the fetus is moving lower into your pelvis and pressing on your bladder.  You may develop or continue to have heartburn as a result of your pregnancy.  You may develop constipation because certain hormones are causing the muscles that push waste through your intestines to slow down.  You may develop hemorrhoids or swollen, bulging veins (varicose veins).  You may have pelvic pain because of the weight gain and pregnancy hormones relaxing your joints between the bones in your pelvis. Backaches may result from overexertion of the muscles supporting your posture.  You may have changes in your hair. These can include thickening of your hair, rapid growth, and changes in texture. Some women also have hair loss during or after pregnancy, or hair that feels dry or thin. Your hair will most likely return to normal after your baby is born.  Your breasts will continue to grow and be tender. A yellow discharge may leak from your breasts called colostrum.  Your belly button may stick out.  You may feel short of breath because of your expanding uterus.  You may notice the fetus "dropping," or moving lower in your abdomen.  You may have a bloody mucus discharge. This usually occurs a few days to a week before labor begins.  Your cervix becomes thin and soft (effaced) near your due date. WHAT TO EXPECT AT YOUR  PRENATAL EXAMS  You will have prenatal exams every 2 weeks until week 36. Then, you will have weekly prenatal exams. During a routine prenatal visit:  You will be weighed to make sure you and the fetus are growing normally.  Your blood pressure is taken.  Your abdomen will be measured to track your baby's growth.  The fetal heartbeat will be listened to.  Any test results from the previous visit will be discussed.  You may have a cervical check near your due date to see if you have effaced. At around 36 weeks, your caregiver will check your cervix. At the same time, your caregiver will also perform a test on the secretions of the vaginal tissue. This test is to determine if a type of bacteria, Group B streptococcus, is present. Your caregiver will explain this further. Your caregiver may ask you:  What your birth plan is.  How you are feeling.  If you are feeling the baby move.  If you have had any abnormal symptoms, such as leaking fluid, bleeding, severe headaches, or abdominal cramping.  If you are using any tobacco products, including cigarettes, chewing tobacco, and electronic cigarettes.  If you have any questions. Other tests or screenings that may be performed during your third trimester include:  Blood tests that check for low iron levels (anemia).  Fetal testing to check the health, activity level, and growth of the fetus. Testing is done if you have certain medical conditions or if   there are problems during the pregnancy.  HIV (human immunodeficiency virus) testing. If you are at high risk, you may be screened for HIV during your third trimester of pregnancy. FALSE LABOR You may feel small, irregular contractions that eventually go away. These are called Braxton Hicks contractions, or false labor. Contractions may last for hours, days, or even weeks before true labor sets in. If contractions come at regular intervals, intensify, or become painful, it is best to be seen  by your caregiver.  SIGNS OF LABOR   Menstrual-like cramps.  Contractions that are 5 minutes apart or less.  Contractions that start on the top of the uterus and spread down to the lower abdomen and back.  A sense of increased pelvic pressure or back pain.  A watery or bloody mucus discharge that comes from the vagina. If you have any of these signs before the 37th week of pregnancy, call your caregiver right away. You need to go to the hospital to get checked immediately. HOME CARE INSTRUCTIONS   Avoid all smoking, herbs, alcohol, and unprescribed drugs. These chemicals affect the formation and growth of the baby.  Do not use any tobacco products, including cigarettes, chewing tobacco, and electronic cigarettes. If you need help quitting, ask your health care provider. You may receive counseling support and other resources to help you quit.  Follow your caregiver's instructions regarding medicine use. There are medicines that are either safe or unsafe to take during pregnancy.  Exercise only as directed by your caregiver. Experiencing uterine cramps is a good sign to stop exercising.  Continue to eat regular, healthy meals.  Wear a good support bra for breast tenderness.  Do not use hot tubs, steam rooms, or saunas.  Wear your seat belt at all times when driving.  Avoid raw meat, uncooked cheese, cat litter boxes, and soil used by cats. These carry germs that can cause birth defects in the baby.  Take your prenatal vitamins.  Take 1500-2000 mg of calcium daily starting at the 20th week of pregnancy until you deliver your baby.  Try taking a stool softener (if your caregiver approves) if you develop constipation. Eat more high-fiber foods, such as fresh vegetables or fruit and whole grains. Drink plenty of fluids to keep your urine clear or pale yellow.  Take warm sitz baths to soothe any pain or discomfort caused by hemorrhoids. Use hemorrhoid cream if your caregiver  approves.  If you develop varicose veins, wear support hose. Elevate your feet for 15 minutes, 3-4 times a day. Limit salt in your diet.  Avoid heavy lifting, wear low heal shoes, and practice good posture.  Rest a lot with your legs elevated if you have leg cramps or low back pain.  Visit your dentist if you have not gone during your pregnancy. Use a soft toothbrush to brush your teeth and be gentle when you floss.  A sexual relationship may be continued unless your caregiver directs you otherwise.  Do not travel far distances unless it is absolutely necessary and only with the approval of your caregiver.  Take prenatal classes to understand, practice, and ask questions about the labor and delivery.  Make a trial run to the hospital.  Pack your hospital bag.  Prepare the baby's nursery.  Continue to go to all your prenatal visits as directed by your caregiver. SEEK MEDICAL CARE IF:  You are unsure if you are in labor or if your water has broken.  You have dizziness.  You have   mild pelvic cramps, pelvic pressure, or nagging pain in your abdominal area.  You have persistent nausea, vomiting, or diarrhea.  You have a bad smelling vaginal discharge.  You have pain with urination. SEEK IMMEDIATE MEDICAL CARE IF:   You have a fever.  You are leaking fluid from your vagina.  You have spotting or bleeding from your vagina.  You have severe abdominal cramping or pain.  You have rapid weight loss or gain.  You have shortness of breath with chest pain.  You notice sudden or extreme swelling of your face, hands, ankles, feet, or legs.  You have not felt your baby move in over an hour.  You have severe headaches that do not go away with medicine.  You have vision changes.   This information is not intended to replace advice given to you by your health care provider. Make sure you discuss any questions you have with your health care provider.   Document Released:  09/07/2001 Document Revised: 10/04/2014 Document Reviewed: 11/14/2012 Elsevier Interactive Patient Education 2016 [employer]  Breastfeeding Deciding to breastfeed is one of the best choices you can make for you and your baby. A change in hormones during pregnancy causes your breast tissue to grow and increases the number and size of your milk ducts. These hormones also allow proteins, sugars, and fats from your blood supply to make breast milk in your milk-producing glands. Hormones prevent breast milk from being released before your baby is born as well as prompt milk flow after birth. Once breastfeeding has begun, thoughts of your baby, as well as his or her sucking or crying, can stimulate the release of milk from your milk-producing glands.  BENEFITS OF BREASTFEEDING For Your Baby  Your first milk (colostrum) helps your baby's digestive system function better.  There are antibodies in your milk that help your baby fight off infections.  Your baby has a lower incidence of asthma, allergies, and sudden infant death syndrome.  The nutrients in breast milk are better for your baby than infant formulas and are designed uniquely for your baby's needs.  Breast milk improves your baby's brain development.  Your baby is less likely to develop other conditions, such as childhood obesity, asthma, or type 2 diabetes mellitus. For You  Breastfeeding helps to create a very special bond between you and your baby.  Breastfeeding is convenient. Breast milk is always available at the correct temperature and costs nothing.  Breastfeeding helps to burn calories and helps you lose the weight gained during pregnancy.  Breastfeeding makes your uterus contract to its prepregnancy size faster and slows bleeding (lochia) after you give birth.   Breastfeeding helps to lower your risk of developing type 2 diabetes mellitus, osteoporosis, and breast or ovarian cancer later in life. SIGNS THAT YOUR BABY IS  HUNGRY Early Signs of Hunger  Increased alertness or activity.  Stretching.  Movement of the head from side to side.  Movement of the head and opening of the mouth when the corner of the mouth or cheek is stroked (rooting).  Increased sucking sounds, smacking lips, cooing, sighing, or squeaking.  Hand-to-mouth movements.  Increased sucking of fingers or hands. Late Signs of Hunger  Fussing.  Intermittent crying. Extreme Signs of Hunger Signs of extreme hunger will require calming and consoling before your baby will be able to breastfeed successfully. Do not wait for the following signs of extreme hunger to occur before you initiate breastfeeding:  Restlessness.  A loud, strong cry.  Screaming.   BREASTFEEDING BASICS Breastfeeding Initiation  Find a comfortable place to sit or lie down, with your neck and back well supported.  Place a pillow or rolled up blanket under your baby to bring him or her to the level of your breast (if you are seated). Nursing pillows are specially designed to help support your arms and your baby while you breastfeed.  Make sure that your baby's abdomen is facing your abdomen.  Gently massage your breast. With your fingertips, massage from your chest wall toward your nipple in a circular motion. This encourages milk flow. You may need to continue this action during the feeding if your milk flows slowly.  Support your breast with 4 fingers underneath and your thumb above your nipple. Make sure your fingers are well away from your nipple and your baby's mouth.  Stroke your baby's lips gently with your finger or nipple.  When your baby's mouth is open wide enough, quickly bring your baby to your breast, placing your entire nipple and as much of the colored area around your nipple (areola) as possible into your baby's mouth.  More areola should be visible above your baby's upper lip than below the lower lip.  Your baby's tongue should be between his  or her lower gum and your breast.  Ensure that your baby's mouth is correctly positioned around your nipple (latched). Your baby's lips should create a seal on your breast and be turned out (everted).  It is common for your baby to suck about 2-3 minutes in order to start the flow of breast milk. Latching Teaching your baby how to latch on to your breast properly is very important. An improper latch can cause nipple pain and decreased milk supply for you and poor weight gain in your baby. Also, if your baby is not latched onto your nipple properly, he or she may swallow some air during feeding. This can make your baby fussy. Burping your baby when you switch breasts during the feeding can help to get rid of the air. However, teaching your baby to latch on properly is still the best way to prevent fussiness from swallowing air while breastfeeding. Signs that your baby has successfully latched on to your nipple:  Silent tugging or silent sucking, without causing you pain.  Swallowing heard between every 3-4 sucks.  Muscle movement above and in front of his or her ears while sucking. Signs that your baby has not successfully latched on to nipple:  Sucking sounds or smacking sounds from your baby while breastfeeding.  Nipple pain. If you think your baby has not latched on correctly, slip your finger into the corner of your baby's mouth to break the suction and place it between your baby's gums. Attempt breastfeeding initiation again. Signs of Successful Breastfeeding Signs from your baby:  A gradual decrease in the number of sucks or complete cessation of sucking.  Falling asleep.  Relaxation of his or her body.  Retention of a small amount of milk in his or her mouth.  Letting go of your breast by himself or herself. Signs from you:  Breasts that have increased in firmness, weight, and size 1-3 hours after feeding.  Breasts that are softer immediately after  breastfeeding.  Increased milk volume, as well as a change in milk consistency and color by the fifth day of breastfeeding.  Nipples that are not sore, cracked, or bleeding. Signs That Your Baby is Getting Enough Milk  Wetting at least 3 diapers in a 24-hour period.   The urine should be clear and pale yellow by age 5 days.  At least 3 stools in a 24-hour period by age 5 days. The stool should be soft and yellow.  At least 3 stools in a 24-hour period by age 7 days. The stool should be seedy and yellow.  No loss of weight greater than 10% of birth weight during the first 3 days of age.  Average weight gain of 4-7 ounces (113-198 g) per week after age 4 days.  Consistent daily weight gain by age 5 days, without weight loss after the age of 2 weeks. After a feeding, your baby may spit up a small amount. This is common. BREASTFEEDING FREQUENCY AND DURATION Frequent feeding will help you make more milk and can prevent sore nipples and breast engorgement. Breastfeed when you feel the need to reduce the fullness of your breasts or when your baby shows signs of hunger. This is called "breastfeeding on demand." Avoid introducing a pacifier to your baby while you are working to establish breastfeeding (the first 4-6 weeks after your baby is born). After this time you may choose to use a pacifier. Research has shown that pacifier use during the first year of a baby's life decreases the risk of sudden infant death syndrome (SIDS). Allow your baby to feed on each breast as long as he or she wants. Breastfeed until your baby is finished feeding. When your baby unlatches or falls asleep while feeding from the first breast, offer the second breast. Because newborns are often sleepy in the first few weeks of life, you may need to awaken your baby to get him or her to feed. Breastfeeding times will vary from baby to baby. However, the following rules can serve as a guide to help you ensure that your baby is  properly fed:  Newborns (babies 4 weeks of age or younger) may breastfeed every 1-3 hours.  Newborns should not go longer than 3 hours during the day or 5 hours during the night without breastfeeding.  You should breastfeed your baby a minimum of 8 times in a 24-hour period until you begin to introduce solid foods to your baby at around 6 months of age. BREAST MILK PUMPING Pumping and storing breast milk allows you to ensure that your baby is exclusively fed your breast milk, even at times when you are unable to breastfeed. This is especially important if you are going back to work while you are still breastfeeding or when you are not able to be present during feedings. Your lactation consultant can give you guidelines on how long it is safe to store breast milk. A breast pump is a machine that allows you to pump milk from your breast into a sterile bottle. The pumped breast milk can then be stored in a refrigerator or freezer. Some breast pumps are operated by hand, while others use electricity. Ask your lactation consultant which type will work best for you. Breast pumps can be purchased, but some hospitals and breastfeeding support groups lease breast pumps on a monthly basis. A lactation consultant can teach you how to hand express breast milk, if you prefer not to use a pump. CARING FOR YOUR BREASTS WHILE YOU BREASTFEED Nipples can become dry, cracked, and sore while breastfeeding. The following recommendations can help keep your breasts moisturized and healthy:  Avoid using soap on your nipples.  Wear a supportive bra. Although not required, special nursing bras and tank tops are designed to allow access to your   breasts for breastfeeding without taking off your entire bra or top. Avoid wearing underwire-style bras or extremely tight bras.  Air dry your nipples for 3-4minutes after each feeding.  Use only cotton bra pads to absorb leaked breast milk. Leaking of breast milk between feedings  is normal.  Use lanolin on your nipples after breastfeeding. Lanolin helps to maintain your skin's normal moisture barrier. If you use pure lanolin, you do not need to wash it off before feeding your baby again. Pure lanolin is not toxic to your baby. You may also hand express a few drops of breast milk and gently massage that milk into your nipples and allow the milk to air dry. In the first few weeks after giving birth, some women experience extremely full breasts (engorgement). Engorgement can make your breasts feel heavy, warm, and tender to the touch. Engorgement peaks within 3-5 days after you give birth. The following recommendations can help ease engorgement:  Completely empty your breasts while breastfeeding or pumping. You may want to start by applying warm, moist heat (in the shower or with warm water-soaked hand towels) just before feeding or pumping. This increases circulation and helps the milk flow. If your baby does not completely empty your breasts while breastfeeding, pump any extra milk after he or she is finished.  Wear a snug bra (nursing or regular) or tank top for 1-2 days to signal your body to slightly decrease milk production.  Apply ice packs to your breasts, unless this is too uncomfortable for you.  Make sure that your baby is latched on and positioned properly while breastfeeding. If engorgement persists after 48 hours of following these recommendations, contact your health care provider or a lactation consultant. OVERALL HEALTH CARE RECOMMENDATIONS WHILE BREASTFEEDING  Eat healthy foods. Alternate between meals and snacks, eating 3 of each per day. Because what you eat affects your breast milk, some of the foods may make your baby more irritable than usual. Avoid eating these foods if you are sure that they are negatively affecting your baby.  Drink milk, fruit juice, and water to satisfy your thirst (about 10 glasses a day).  Rest often, relax, and continue to take  your prenatal vitamins to prevent fatigue, stress, and anemia.  Continue breast self-awareness checks.  Avoid chewing and smoking tobacco. Chemicals from cigarettes that pass into breast milk and exposure to secondhand smoke may harm your baby.  Avoid alcohol and drug use, including marijuana. Some medicines that may be harmful to your baby can pass through breast milk. It is important to ask your health care provider before taking any medicine, including all over-the-counter and prescription medicine as well as vitamin and herbal supplements. It is possible to become pregnant while breastfeeding. If birth control is desired, ask your health care provider about options that will be safe for your baby. SEEK MEDICAL CARE IF:  You feel like you want to stop breastfeeding or have become frustrated with breastfeeding.  You have painful breasts or nipples.  Your nipples are cracked or bleeding.  Your breasts are red, tender, or warm.  You have a swollen area on either breast.  You have a fever or chills.  You have nausea or vomiting.  You have drainage other than breast milk from your nipples.  Your breasts do not become full before feedings by the fifth day after you give birth.  You feel sad and depressed.  Your baby is too sleepy to eat well.  Your baby is having trouble sleeping.     Your baby is wetting less than 3 diapers in a 24-hour period.  Your baby has less than 3 stools in a 24-hour period.  Your baby's skin or the white part of his or her eyes becomes yellow.   Your baby is not gaining weight by 5 days of age. SEEK IMMEDIATE MEDICAL CARE IF:  Your baby is overly tired (lethargic) and does not want to wake up and feed.  Your baby develops an unexplained fever.   This information is not intended to replace advice given to you by your health care provider. Make sure you discuss any questions you have with your health care provider.   Document Released: 09/13/2005  Document Revised: 06/04/2015 Document Reviewed: 03/07/2013 Elsevier Interactive Patient Education 2016 [employer]  

## 2016-01-05 NOTE — Progress Notes (Signed)
Pt would like Zofran for nausea

## 2016-01-05 NOTE — Progress Notes (Signed)
Subjective:  Kristine Beard is a 40 y.o. G3P2002 at [redacted]w[redacted]d being seen today for ongoing prenatal care.  She is currently monitored for the following issues for this low-risk pregnancy and has Previous cesarean delivery, antepartum; Maternal anemia complicating pregnancy, childbirth, or the puerperium; Supervision of other normal pregnancy, antepartum; and AMA (advanced maternal age) multigravida 35+ on her problem list.  Patient reports no complaints.  Contractions: Not present. Vag. Bleeding: None.  Movement: Absent. Denies leaking of fluid.   The following portions of the patient's history were reviewed and updated as appropriate: allergies, current medications, past family history, past medical history, past social history, past surgical history and problem list. Problem list updated.  Objective:   Filed Vitals:   01/05/16 1515  BP: 114/71  Pulse: 94  Weight: 171 lb (77.565 kg)    Fetal Status: Fetal Heart Rate (bpm): 140   Movement: Absent     General:  Alert, oriented and cooperative. Patient is in no acute distress.  Skin: Skin is warm and dry. No rash noted.   Cardiovascular: Normal heart rate noted  Respiratory: Normal respiratory effort, no problems with respiration noted  Abdomen: Soft, gravid, appropriate for gestational age. Pain/Pressure: Absent     Pelvic: Vag. Bleeding: None Vag D/C Character: Thin   Cervical exam deferred        Extremities: Normal range of motion.  Edema: None  Mental Status: Normal mood and affect. Normal behavior. Normal judgment and thought content.   Urinalysis: Urine Protein: Negative Urine Glucose: Negative  Assessment and Plan:  Pregnancy: G3P2002 at [redacted]w[redacted]d  1. Supervision of other normal pregnancy, antepartum, second trimester Continue routine prenatal care. Has anatomy scheduled at [facility] office.  2. AMA (advanced maternal age) multigravida 67+, second trimester Declines genetics  3. Nausea and vomiting of pregnancy, antepartum rx  for Phenergan - promethazine (PHENERGAN) 25 MG tablet; Take 1 tablet (25 mg total) by mouth every 6 (six) hours as needed for nausea.  Dispense: 42 tablet; Refill: 2  4. Previous cesarean delivery, antepartum For RCS at 37 wks  General obstetric precautions including but not limited to vaginal bleeding, contractions, leaking of fluid and fetal movement were reviewed in detail with the patient. Please refer to After Visit Summary for other counseling recommendations.  Return in 4 weeks (on 02/02/2016).   Donnamae Jude, MD

## 2016-01-20 ENCOUNTER — Encounter: Payer: Self-pay | Admitting: *Deleted

## 2016-02-02 ENCOUNTER — Ambulatory Visit (INDEPENDENT_AMBULATORY_CARE_PROVIDER_SITE_OTHER): Payer: Self-pay | Admitting: Family Medicine

## 2016-02-02 ENCOUNTER — Encounter: Payer: Self-pay | Admitting: Family Medicine

## 2016-02-02 VITALS — BP 114/69 | HR 88 | Wt 175.0 lb

## 2016-02-02 DIAGNOSIS — O3429 Maternal care due to uterine scar from other previous surgery: Secondary | ICD-10-CM | POA: Insufficient documentation

## 2016-02-02 DIAGNOSIS — Z3482 Encounter for supervision of other normal pregnancy, second trimester: Secondary | ICD-10-CM

## 2016-02-02 DIAGNOSIS — O34219 Maternal care for unspecified type scar from previous cesarean delivery: Secondary | ICD-10-CM

## 2016-02-02 NOTE — Patient Instructions (Signed)
Second Trimester of Pregnancy The second trimester is from week 13 through week 28, months 4 through 6. The second trimester is often a time when you feel your best. Your body has also adjusted to being pregnant, and you begin to feel better physically. Usually, morning sickness has lessened or quit completely, you may have more energy, and you may have an increase in appetite. The second trimester is also a time when the fetus is growing rapidly. At the end of the sixth month, the fetus is about 9 inches long and weighs about 1 pounds. You will likely begin to feel the baby move (quickening) between 18 and 20 weeks of the pregnancy. BODY CHANGES Your body goes through many changes during pregnancy. The changes vary from woman to woman.   Your weight will continue to increase. You will notice your lower abdomen bulging out.  You may begin to get stretch marks on your hips, abdomen, and breasts.  You may develop headaches that can be relieved by medicines approved by your health care provider.  You may urinate more often because the fetus is pressing on your bladder.  You may develop or continue to have heartburn as a result of your pregnancy.  You may develop constipation because certain hormones are causing the muscles that push waste through your intestines to slow down.  You may develop hemorrhoids or swollen, bulging veins (varicose veins).  You may have back pain because of the weight gain and pregnancy hormones relaxing your joints between the bones in your pelvis and as a result of a shift in weight and the muscles that support your balance.  Your breasts will continue to grow and be tender.  Your gums may bleed and may be sensitive to brushing and flossing.  Dark spots or blotches (chloasma, mask of pregnancy) may develop on your face. This will likely fade after the baby is born.  A dark line from your belly button to the pubic area (linea nigra) may appear. This will likely  fade after the baby is born.  You may have changes in your hair. These can include thickening of your hair, rapid growth, and changes in texture. Some women also have hair loss during or after pregnancy, or hair that feels dry or thin. Your hair will most likely return to normal after your baby is born. WHAT TO EXPECT AT YOUR PRENATAL VISITS During a routine prenatal visit:  You will be weighed to make sure you and the fetus are growing normally.  Your blood pressure will be taken.  Your abdomen will be measured to track your baby's growth.  The fetal heartbeat will be listened to.  Any test results from the previous visit will be discussed. Your health care provider may ask you:  How you are feeling.  If you are feeling the baby move.  If you have had any abnormal symptoms, such as leaking fluid, bleeding, severe headaches, or abdominal cramping.  If you are using any tobacco products, including cigarettes, chewing tobacco, and electronic cigarettes.  If you have any questions. Other tests that may be performed during your second trimester include:  Blood tests that check for:  Low iron levels (anemia).  Gestational diabetes (between 24 and 28 weeks).  Rh antibodies.  Urine tests to check for infections, diabetes, or protein in the urine.  An ultrasound to confirm the proper growth and development of the baby.  An amniocentesis to check for possible genetic problems.  Fetal screens for spina bifida   and Down syndrome.  HIV (human immunodeficiency virus) testing. Routine prenatal testing includes screening for HIV, unless you choose not to have this test. HOME CARE INSTRUCTIONS   Avoid all smoking, herbs, alcohol, and unprescribed drugs. These chemicals affect the formation and growth of the baby.  Do not use any tobacco products, including cigarettes, chewing tobacco, and electronic cigarettes. If you need help quitting, ask your health care provider. You may receive  counseling support and other resources to help you quit.  Follow your health care provider's instructions regarding medicine use. There are medicines that are either safe or unsafe to take during pregnancy.  Exercise only as directed by your health care provider. Experiencing uterine cramps is a good sign to stop exercising.  Continue to eat regular, healthy meals.  Wear a good support bra for breast tenderness.  Do not use hot tubs, steam rooms, or saunas.  Wear your seat belt at all times when driving.  Avoid raw meat, uncooked cheese, cat litter boxes, and soil used by cats. These carry germs that can cause birth defects in the baby.  Take your prenatal vitamins.  Take 1500-2000 mg of calcium daily starting at the 20th week of pregnancy until you deliver your baby.  Try taking a stool softener (if your health care provider approves) if you develop constipation. Eat more high-fiber foods, such as fresh vegetables or fruit and whole grains. Drink plenty of fluids to keep your urine clear or pale yellow.  Take warm sitz baths to soothe any pain or discomfort caused by hemorrhoids. Use hemorrhoid cream if your health care provider approves.  If you develop varicose veins, wear support hose. Elevate your feet for 15 minutes, 3-4 times a day. Limit salt in your diet.  Avoid heavy lifting, wear low heel shoes, and practice good posture.  Rest with your legs elevated if you have leg cramps or low back pain.  Visit your dentist if you have not gone yet during your pregnancy. Use a soft toothbrush to brush your teeth and be gentle when you floss.  A sexual relationship may be continued unless your health care provider directs you otherwise.  Continue to go to all your prenatal visits as directed by your health care provider. SEEK MEDICAL CARE IF:   You have dizziness.  You have mild pelvic cramps, pelvic pressure, or nagging pain in the abdominal area.  You have persistent nausea,  vomiting, or diarrhea.  You have a bad smelling vaginal discharge.  You have pain with urination. SEEK IMMEDIATE MEDICAL CARE IF:   You have a fever.  You are leaking fluid from your vagina.  You have spotting or bleeding from your vagina.  You have severe abdominal cramping or pain.  You have rapid weight gain or loss.  You have shortness of breath with chest pain.  You notice sudden or extreme swelling of your face, hands, ankles, feet, or legs.  You have not felt your baby move in over an hour.  You have severe headaches that do not go away with medicine.  You have vision changes.   This information is not intended to replace advice given to you by your health care provider. Make sure you discuss any questions you have with your health care provider.   Document Released: 09/07/2001 Document Revised: 10/04/2014 Document Reviewed: 11/14/2012 Elsevier Interactive Patient Education [employer].  Breastfeeding Deciding to breastfeed is one of the best choices you can make for you and your baby. A change in  hormones during pregnancy causes your breast tissue to grow and increases the number and size of your milk ducts. These hormones also allow proteins, sugars, and fats from your blood supply to make breast milk in your milk-producing glands. Hormones prevent breast milk from being released before your baby is born as well as prompt milk flow after birth. Once breastfeeding has begun, thoughts of your baby, as well as his or her sucking or crying, can stimulate the release of milk from your milk-producing glands.  BENEFITS OF BREASTFEEDING For Your Baby  Your first milk (colostrum) helps your baby's digestive system function better.  There are antibodies in your milk that help your baby fight off infections.  Your baby has a lower incidence of asthma, allergies, and sudden infant death syndrome.  The nutrients in breast milk are better for your baby than infant  formulas and are designed uniquely for your baby's needs.  Breast milk improves your baby's brain development.  Your baby is less likely to develop other conditions, such as childhood obesity, asthma, or type 2 diabetes mellitus. For You  Breastfeeding helps to create a very special bond between you and your baby.  Breastfeeding is convenient. Breast milk is always available at the correct temperature and costs nothing.  Breastfeeding helps to burn calories and helps you lose the weight gained during pregnancy.  Breastfeeding makes your uterus contract to its prepregnancy size faster and slows bleeding (lochia) after you give birth.   Breastfeeding helps to lower your risk of developing type 2 diabetes mellitus, osteoporosis, and breast or ovarian cancer later in life. SIGNS THAT YOUR BABY IS HUNGRY Early Signs of Hunger  Increased alertness or activity.  Stretching.  Movement of the head from side to side.  Movement of the head and opening of the mouth when the corner of the mouth or cheek is stroked (rooting).  Increased sucking sounds, smacking lips, cooing, sighing, or squeaking.  Hand-to-mouth movements.  Increased sucking of fingers or hands. Late Signs of Hunger  Fussing.  Intermittent crying. Extreme Signs of Hunger Signs of extreme hunger will require calming and consoling before your baby will be able to breastfeed successfully. Do not wait for the following signs of extreme hunger to occur before you initiate breastfeeding:  Restlessness.  A loud, strong cry.  Screaming. BREASTFEEDING BASICS Breastfeeding Initiation  Find a comfortable place to sit or lie down, with your neck and back well supported.  Place a pillow or rolled up blanket under your baby to bring him or her to the level of your breast (if you are seated). Nursing pillows are specially designed to help support your arms and your baby while you breastfeed.  Make sure that your baby's  abdomen is facing your abdomen.  Gently massage your breast. With your fingertips, massage from your chest wall toward your nipple in a circular motion. This encourages milk flow. You may need to continue this action during the feeding if your milk flows slowly.  Support your breast with 4 fingers underneath and your thumb above your nipple. Make sure your fingers are well away from your nipple and your baby's mouth.  Stroke your baby's lips gently with your finger or nipple.  When your baby's mouth is open wide enough, quickly bring your baby to your breast, placing your entire nipple and as much of the colored area around your nipple (areola) as possible into your baby's mouth.  More areola should be visible above your baby's upper lip than below  the lower lip.  Your baby's tongue should be between his or her lower gum and your breast.  Ensure that your baby's mouth is correctly positioned around your nipple (latched). Your baby's lips should create a seal on your breast and be turned out (everted).  It is common for your baby to suck about 2-3 minutes in order to start the flow of breast milk. Latching Teaching your baby how to latch on to your breast properly is very important. An improper latch can cause nipple pain and decreased milk supply for you and poor weight gain in your baby. Also, if your baby is not latched onto your nipple properly, he or she may swallow some air during feeding. This can make your baby fussy. Burping your baby when you switch breasts during the feeding can help to get rid of the air. However, teaching your baby to latch on properly is still the best way to prevent fussiness from swallowing air while breastfeeding. Signs that your baby has successfully latched on to your nipple:  Silent tugging or silent sucking, without causing you pain.  Swallowing heard between every 3-4 sucks.  Muscle movement above and in front of his or her ears while sucking. Signs  that your baby has not successfully latched on to nipple:  Sucking sounds or smacking sounds from your baby while breastfeeding.  Nipple pain. If you think your baby has not latched on correctly, slip your finger into the corner of your baby's mouth to break the suction and place it between your baby's gums. Attempt breastfeeding initiation again. Signs of Successful Breastfeeding Signs from your baby:  A gradual decrease in the number of sucks or complete cessation of sucking.  Falling asleep.  Relaxation of his or her body.  Retention of a small amount of milk in his or her mouth.  Letting go of your breast by himself or herself. Signs from you:  Breasts that have increased in firmness, weight, and size 1-3 hours after feeding.  Breasts that are softer immediately after breastfeeding.  Increased milk volume, as well as a change in milk consistency and color by the fifth day of breastfeeding.  Nipples that are not sore, cracked, or bleeding. Signs That Your [employer] is Getting Enough Milk  Wetting at least 3 diapers in a 24-hour period. The urine should be clear and pale yellow by age 275 days.  At least 3 stools in a 24-hour period by age 275 days. The stool should be soft and yellow.  At least 3 stools in a 24-hour period by age 27 days. The stool should be seedy and yellow.  No loss of weight greater than 10% of birth weight during the first 30 days of age.  Average weight gain of 4-7 ounces (113-198 g) per week after age 36 days.  Consistent daily weight gain by age 94 days, without weight loss after the age of 2 weeks. After a feeding, your baby may spit up a small amount. This is common. BREASTFEEDING FREQUENCY AND DURATION Frequent feeding will help you make more milk and can prevent sore nipples and breast engorgement. Breastfeed when you feel the need to reduce the fullness of your breasts or when your baby shows signs of hunger. This is called "breastfeeding on demand." Avoid  introducing a pacifier to your baby while you are working to establish breastfeeding (the first 4-6 weeks after your baby is born). After this time you may choose to use a pacifier. Research has shown that pacifier  use during the first year of a baby's life decreases the risk of sudden infant death syndrome (SIDS). Allow your baby to feed on each breast as long as he or she wants. Breastfeed until your baby is finished feeding. When your baby unlatches or falls asleep while feeding from the first breast, offer the second breast. Because newborns are often sleepy in the first few weeks of life, you may need to awaken your baby to get him or her to feed. Breastfeeding times will vary from baby to baby. However, the following rules can serve as a guide to help you ensure that your baby is properly fed:  Newborns (babies 56 weeks of age or younger) may breastfeed every 1-3 hours.  Newborns should not go longer than 3 hours during the day or 5 hours during the night without breastfeeding.  You should breastfeed your baby a minimum of 8 times in a 24-hour period until you begin to introduce solid foods to your baby at around 55 months of age. BREAST MILK PUMPING Pumping and storing breast milk allows you to ensure that your baby is exclusively fed your breast milk, even at times when you are unable to breastfeed. This is especially important if you are going back to work while you are still breastfeeding or when you are not able to be present during feedings. Your lactation consultant can give you guidelines on how long it is safe to store breast milk. A breast pump is a machine that allows you to pump milk from your breast into a sterile bottle. The pumped breast milk can then be stored in a refrigerator or freezer. Some breast pumps are operated by hand, while others use electricity. Ask your lactation consultant which type will work best for you. Breast pumps can be purchased, but some hospitals and  breastfeeding support groups lease breast pumps on a monthly basis. A lactation consultant can teach you how to hand express breast milk, if you prefer not to use a pump. CARING FOR YOUR BREASTS WHILE YOU BREASTFEED Nipples can become dry, cracked, and sore while breastfeeding. The following recommendations can help keep your breasts moisturized and healthy:  Avoid using soap on your nipples.  Wear a supportive bra. Although not required, special nursing bras and tank tops are designed to allow access to your breasts for breastfeeding without taking off your entire bra or top. Avoid wearing underwire-style bras or extremely tight bras.  Air dry your nipples for 3-40minutes after each feeding.  Use only cotton bra pads to absorb leaked breast milk. Leaking of breast milk between feedings is normal.  Use lanolin on your nipples after breastfeeding. Lanolin helps to maintain your skin's normal moisture barrier. If you use pure lanolin, you do not need to wash it off before feeding your baby again. Pure lanolin is not toxic to your baby. You may also hand express a few drops of breast milk and gently massage that milk into your nipples and allow the milk to air dry. In the first few weeks after giving birth, some women experience extremely full breasts (engorgement). Engorgement can make your breasts feel heavy, warm, and tender to the touch. Engorgement peaks within 3-5 days after you give birth. The following recommendations can help ease engorgement:  Completely empty your breasts while breastfeeding or pumping. You may want to start by applying warm, moist heat (in the shower or with warm water-soaked hand towels) just before feeding or pumping. This increases circulation and helps the milk flow.  If your baby does not completely empty your breasts while breastfeeding, pump any extra milk after he or she is finished.  Wear a snug bra (nursing or regular) or tank top for 1-2 days to signal your body  to slightly decrease milk production.  Apply ice packs to your breasts, unless this is too uncomfortable for you.  Make sure that your baby is latched on and positioned properly while breastfeeding. If engorgement persists after 48 hours of following these recommendations, contact your health care provider or a Science writer. OVERALL HEALTH CARE RECOMMENDATIONS WHILE BREASTFEEDING  Eat healthy foods. Alternate between meals and snacks, eating 3 of each per day. Because what you eat affects your breast milk, some of the foods may make your baby more irritable than usual. Avoid eating these foods if you are sure that they are negatively affecting your baby.  Drink milk, fruit juice, and water to satisfy your thirst (about 10 glasses a day).  Rest often, relax, and continue to take your prenatal vitamins to prevent fatigue, stress, and anemia.  Continue breast self-awareness checks.  Avoid chewing and smoking tobacco. Chemicals from cigarettes that pass into breast milk and exposure to secondhand smoke may harm your baby.  Avoid alcohol and drug use, including marijuana. Some medicines that may be harmful to your baby can pass through breast milk. It is important to ask your health care provider before taking any medicine, including all over-the-counter and prescription medicine as well as vitamin and herbal supplements. It is possible to become pregnant while breastfeeding. If birth control is desired, ask your health care provider about options that will be safe for your baby. SEEK MEDICAL CARE IF:  You feel like you want to stop breastfeeding or have become frustrated with breastfeeding.  You have painful breasts or nipples.  Your nipples are cracked or bleeding.  Your breasts are red, tender, or warm.  You have a swollen area on either breast.  You have a fever or chills.  You have nausea or vomiting.  You have drainage other than breast milk from your nipples.  Your  breasts do not become full before feedings by the fifth day after you give birth.  You feel sad and depressed.  Your baby is too sleepy to eat well.  Your baby is having trouble sleeping.   Your baby is wetting less than 3 diapers in a 24-hour period.  Your baby has less than 3 stools in a 24-hour period.  Your baby's skin or the white part of his or her eyes becomes yellow.   Your baby is not gaining weight by 52 days of age. SEEK IMMEDIATE MEDICAL CARE IF:  Your baby is overly tired (lethargic) and does not want to wake up and feed.  Your baby develops an unexplained fever.   This information is not intended to replace advice given to you by your health care provider. Make sure you discuss any questions you have with your health care provider.   Document Released: 09/13/2005 Document Revised: 06/04/2015 Document Reviewed: 03/07/2013 Elsevier Interactive Patient Education [employer].

## 2016-02-02 NOTE — Progress Notes (Signed)
Subjective:  Kristine Beard is a 40 y.o. G3P2002 at [redacted]w[redacted]d being seen today for ongoing prenatal care.  She is currently monitored for the following issues for this low-risk pregnancy and has Previous cesarean delivery, antepartum; Maternal anemia complicating pregnancy, childbirth, or the puerperium; Supervision of other normal pregnancy, antepartum; and AMA (advanced maternal age) multigravida 35+ on her problem list.  Patient reports no complaints.  Contractions: Irregular. Vag. Bleeding: None.  Movement: Present. Denies leaking of fluid.   The following portions of the patient's history were reviewed and updated as appropriate: allergies, current medications, past family history, past medical history, past social history, past surgical history and problem list. Problem list updated.  Objective:   Filed Vitals:   02/02/16 1521  BP: 114/69  Pulse: 88  Weight: 175 lb (79.379 kg)    Fetal Status: Fetal Heart Rate (bpm): 147 Fundal Height: 22 cm Movement: Present     General:  Alert, oriented and cooperative. Patient is in no acute distress.  Skin: Skin is warm and dry. No rash noted.   Cardiovascular: Normal heart rate noted  Respiratory: Normal respiratory effort, no problems with respiration noted  Abdomen: Soft, gravid, appropriate for gestational age. Pain/Pressure: Absent     Pelvic: Vag. Bleeding: None Vag D/C Character: Thin   Cervical exam deferred        Extremities: Normal range of motion.  Edema: None  Mental Status: Normal mood and affect. Normal behavior. Normal judgment and thought content.   Urinalysis: Urine Protein: Negative Urine Glucose: Negative  Assessment and Plan:  Pregnancy: G3P2002 at [redacted]w[redacted]d  1. Supervision of other normal pregnancy, antepartum, second trimester Continue routine prenatal care.   2. Previous cesarean delivery, antepartum For RLTCS at 37 wks  Preterm labor symptoms and general obstetric precautions including but not limited to vaginal  bleeding, contractions, leaking of fluid and fetal movement were reviewed in detail with the patient. Please refer to After Visit Summary for other counseling recommendations.  Return in 4 weeks (on 03/01/2016).   Donnamae Jude, MD

## 2016-02-02 NOTE — Progress Notes (Signed)
Pt started experiencing "tightness" in her abdomen a few days ago, states it happens twice a day.

## 2016-03-01 ENCOUNTER — Ambulatory Visit (INDEPENDENT_AMBULATORY_CARE_PROVIDER_SITE_OTHER): Payer: Self-pay | Admitting: Obstetrics & Gynecology

## 2016-03-01 VITALS — BP 112/69 | HR 81 | Wt 178.0 lb

## 2016-03-01 DIAGNOSIS — O09522 Supervision of elderly multigravida, second trimester: Secondary | ICD-10-CM

## 2016-03-01 DIAGNOSIS — Z3482 Encounter for supervision of other normal pregnancy, second trimester: Secondary | ICD-10-CM

## 2016-03-01 NOTE — Progress Notes (Signed)
Subjective:  Kristine Beard is a 40 y.o. G3P2002 at [redacted]w[redacted]d being seen today for ongoing prenatal care.  She is currently monitored for the following issues for this low-risk pregnancy and has Previous cesarean delivery, antepartum; Maternal anemia complicating pregnancy, childbirth, or the puerperium; Supervision of other normal pregnancy, antepartum; AMA (advanced maternal age) multigravida 20+; and Pregnancy with history of uterine myomectomy on her problem list.  Patient reports no complaints.  Contractions: Not present. Vag. Bleeding: None.  Movement: Present. Denies leaking of fluid.   The following portions of the patient's history were reviewed and updated as appropriate: allergies, current medications, past family history, past medical history, past social history, past surgical history and problem list. Problem list updated.  Objective:   Filed Vitals:   03/01/16 1521  BP: 112/69  Pulse: 81  Weight: 178 lb (80.74 kg)    Fetal Status: Fetal Heart Rate (bpm): 137 Fundal Height: 26 cm Movement: Present     General:  Alert, oriented and cooperative. Patient is in no acute distress.  Skin: Skin is warm and dry. No rash noted.   Cardiovascular: Normal heart rate noted  Respiratory: Normal respiratory effort, no problems with respiration noted  Abdomen: Soft, gravid, appropriate for gestational age. Pain/Pressure: Present     Pelvic: Vag. Bleeding: None Vag D/C Character: Thin   Cervical exam deferred        Extremities: Normal range of motion.  Edema: None  Mental Status: Normal mood and affect. Normal behavior. Normal judgment and thought content.   Urinalysis: Urine Protein: Negative Urine Glucose: Negative  Assessment and Plan:  Pregnancy: G3P2002 at [redacted]w[redacted]d  1. Supervision of other normal pregnancy, antepartum, second trimester Will arrange to get Tdap in third trimester from [facility]. Preterm labor symptoms and general obstetric precautions including but not limited to vaginal  bleeding, contractions, leaking of fluid and fetal movement were reviewed in detail with the patient. Please refer to After Visit Summary for other counseling recommendations.  Return in about 3 weeks (around 03/24/2016) for 1 hr GTT, 3rd trimester labs, OB Visit.   Osborne Oman, MD

## 2016-03-01 NOTE — Patient Instructions (Addendum)
Return to clinic for any scheduled appointments or obstetric concerns, or go to [facility] for evaluation  Tdap Vaccine (Tetanus, Diphtheria and Pertussis): What You Need to Know 1. Why get vaccinated? Tetanus, diphtheria and pertussis are very serious diseases. Tdap vaccine can protect us from these diseases. And, Tdap vaccine given to pregnant women can protect newborn babies against pertussis. TETANUS (Lockjaw) is rare in the [place] today. It causes painful muscle tightening and stiffness, usually all over the body.  It can lead to tightening of muscles in the head and neck so you can't open your mouth, swallow, or sometimes even breathe. Tetanus kills about 1 out of 10 people who are infected even after receiving the best medical care. DIPHTHERIA is also rare in the [place] today. It can cause a thick coating to form in the back of the throat.  It can lead to breathing problems, heart failure, paralysis, and death. PERTUSSIS (Whooping Cough) causes severe coughing spells, which can cause difficulty breathing, vomiting and disturbed sleep.  It can also lead to weight loss, incontinence, and rib fractures. Up to 2 in 100 adolescents and 5 in 100 adults with pertussis are hospitalized or have complications, which could include pneumonia or death. These diseases are caused by bacteria. Diphtheria and pertussis are spread from person to person through secretions from coughing or sneezing. Tetanus enters the body through cuts, scratches, or wounds. Before vaccines, as many as 200,000 cases of diphtheria, 200,000 cases of pertussis, and hundreds of cases of tetanus, were reported in the [place] each year. Since vaccination began, reports of cases for tetanus and diphtheria have dropped by about 99% and for pertussis by about 80%. 2. Tdap vaccine Tdap vaccine can protect adolescents and adults from tetanus, diphtheria, and pertussis. One dose of Tdap is routinely given at age 11 or 12.  People who did not get Tdap at that age should get it as soon as possible. Tdap is especially important for healthcare professionals and anyone having close contact with a baby younger than 12 months. Pregnant women should get a dose of Tdap during every pregnancy, to protect the newborn from pertussis. Infants are most at risk for severe, life-threatening complications from pertussis. Another vaccine, called Td, protects against tetanus and diphtheria, but not pertussis. A Td booster should be given every 10 years. Tdap may be given as one of these boosters if you have never gotten Tdap before. Tdap may also be given after a severe cut or burn to prevent tetanus infection. Your doctor or the person giving you the vaccine can give you more information. Tdap may safely be given at the same time as other vaccines. 3. Some people should not get this vaccine  A person who has ever had a life-threatening allergic reaction after a previous dose of any diphtheria, tetanus or pertussis containing vaccine, OR has a severe allergy to any part of this vaccine, should not get Tdap vaccine. Tell the person giving the vaccine about any severe allergies.  Anyone who had coma or long repeated seizures within 7 days after a childhood dose of DTP or DTaP, or a previous dose of Tdap, should not get Tdap, unless a cause other than the vaccine was found. They can still get Td.  Talk to your doctor if you:  have seizures or another nervous system problem,  had severe pain or swelling after any vaccine containing diphtheria, tetanus or pertussis,  ever had a condition called Guillain-Barr Syndrome (GBS),  aren't feeling   well on the day the shot is scheduled. 4. Risks With any medicine, including vaccines, there is a chance of side effects. These are usually mild and go away on their own. Serious reactions are also possible but are rare. Most people who get Tdap vaccine do not have any problems with it. Mild  problems following Tdap (Did not interfere with activities)  Pain where the shot was given (about 3 in 4 adolescents or 2 in 3 adults)  Redness or swelling where the shot was given (about 1 person in 5)  Mild fever of at least 100.4F (up to about 1 in 25 adolescents or 1 in 100 adults)  Headache (about 3 or 4 people in 10)  Tiredness (about 1 person in 3 or 4)  Nausea, vomiting, diarrhea, stomach ache (up to 1 in 4 adolescents or 1 in 10 adults)  Chills, sore joints (about 1 person in 10)  Body aches (about 1 person in 3 or 4)  Rash, swollen glands (uncommon) Moderate problems following Tdap (Interfered with activities, but did not require medical attention)  Pain where the shot was given (up to 1 in 5 or 6)  Redness or swelling where the shot was given (up to about 1 in 16 adolescents or 1 in 12 adults)  Fever over 102F (about 1 in 100 adolescents or 1 in 250 adults)  Headache (about 1 in 7 adolescents or 1 in 10 adults)  Nausea, vomiting, diarrhea, stomach ache (up to 1 or 3 people in 100)  Swelling of the entire arm where the shot was given (up to about 1 in 500). Severe problems following Tdap (Unable to perform usual activities; required medical attention)  Swelling, severe pain, bleeding and redness in the arm where the shot was given (rare). Problems that could happen after any vaccine:  People sometimes faint after a medical procedure, including vaccination. Sitting or lying down for about 15 minutes can help prevent fainting, and injuries caused by a fall. Tell your doctor if you feel dizzy, or have vision changes or ringing in the ears.  Some people get severe pain in the shoulder and have difficulty moving the arm where a shot was given. This happens very rarely.  Any medication can cause a severe allergic reaction. Such reactions from a vaccine are very rare, estimated at fewer than 1 in a million doses, and would happen within a few minutes to a few hours  after the vaccination. As with any medicine, there is a very remote chance of a vaccine causing a serious injury or death. The safety of vaccines is always being monitored. For more information, visit: www.cdc.gov/vaccinesafety/ 5. What if there is a serious problem? What should I look for?  Look for anything that concerns you, such as signs of a severe allergic reaction, very high fever, or unusual behavior.  Signs of a severe allergic reaction can include hives, swelling of the face and throat, difficulty breathing, a fast heartbeat, dizziness, and weakness. These would usually start a few minutes to a few hours after the vaccination. What should I do?  If you think it is a severe allergic reaction or other emergency that can't wait, call 9-1-1 or get the person to the nearest hospital. Otherwise, call your doctor.  Afterward, the reaction should be reported to the Vaccine Adverse Event Reporting System (VAERS). Your doctor might file this report, or you can do it yourself through the VAERS web site at www.vaers.hhs.gov, or by calling 1-800-822-7967. VAERS does not   give medical advice.  6. The National Vaccine Injury Compensation Program The [employer] Vaccine Injury Compensation Program (VICP) is a federal program that was created to compensate people who may have been injured by certain vaccines. Persons who believe they may have been injured by a vaccine can learn about the program and about filing a claim by calling 808-783-3796 or visiting the [place] website at GoldCloset.com.ee. There is a time limit to file a claim for compensation. 7. How can I learn more?  Ask your doctor. He or she can give you the vaccine package insert or suggest other sources of information.  Call your local or state health department.  Contact the Centers for Disease Control and Prevention (CDC):  Call (628)152-7925 (1-800-CDC-INFO) or  Visit CDC's website at http://hunter.com/ CDC Tdap  Vaccine VIS (11/20/13)   This information is not intended to replace advice given to you by your health care provider. Make sure you discuss any questions you have with your health care provider.   Document Released: 03/14/2012 Document Revised: 10/04/2014 Document Reviewed: 12/26/2013 Elsevier Interactive Patient Education [employer].

## 2016-03-24 ENCOUNTER — Encounter: Payer: Self-pay | Admitting: Obstetrics & Gynecology

## 2016-03-24 ENCOUNTER — Encounter: Payer: Self-pay | Admitting: *Deleted

## 2016-03-24 ENCOUNTER — Ambulatory Visit (INDEPENDENT_AMBULATORY_CARE_PROVIDER_SITE_OTHER): Payer: Self-pay | Admitting: Obstetrics & Gynecology

## 2016-03-24 VITALS — BP 113/69 | HR 77 | Wt 181.0 lb

## 2016-03-24 DIAGNOSIS — O09523 Supervision of elderly multigravida, third trimester: Secondary | ICD-10-CM

## 2016-03-24 DIAGNOSIS — O34219 Maternal care for unspecified type scar from previous cesarean delivery: Secondary | ICD-10-CM

## 2016-03-24 DIAGNOSIS — Z36 Encounter for antenatal screening of mother: Secondary | ICD-10-CM

## 2016-03-24 DIAGNOSIS — O3429 Maternal care due to uterine scar from other previous surgery: Secondary | ICD-10-CM

## 2016-03-24 DIAGNOSIS — Z3483 Encounter for supervision of other normal pregnancy, third trimester: Secondary | ICD-10-CM

## 2016-03-24 LAB — CBC
HEMATOCRIT: 27.8 % — AB (ref 35.0–45.0)
Hemoglobin: 9.2 g/dL — ABNORMAL LOW (ref 11.7–15.5)
MCH: 30.8 pg (ref 27.0–33.0)
MCHC: 33.1 g/dL (ref 32.0–36.0)
MCV: 93 fL (ref 80.0–100.0)
MPV: 9 fL (ref 7.5–12.5)
Platelets: 208 10*3/uL (ref 140–400)
RBC: 2.99 MIL/uL — ABNORMAL LOW (ref 3.80–5.10)
RDW: 14 % (ref 11.0–15.0)
WBC: 6.6 10*3/uL (ref 3.8–10.8)

## 2016-03-24 NOTE — Patient Instructions (Signed)
Sterilization Information, Female Female sterilization is a procedure to permanently prevent pregnancy. There are different ways to perform sterilization, but all either block or close the fallopian tubes so that your eggs cannot reach your uterus. If your egg cannot reach your uterus, sperm cannot fertilize the egg, and you cannot get pregnant.  Sterilization is performed by a surgical procedure. Sometimes these procedures are performed in a hospital while a patient is asleep. Sometimes they can be done in a clinic setting with the patient awake. The fallopian tubes can be surgically cut, tied, or sealed through a procedure called tubal ligation. The fallopian tubes can also be closed with clips or rings. Sterilization can also be done by placing a tiny coil into each fallopian tube, which causes scar tissue to grow inside the tube. The scar tissue then blocks the tubes.  Discuss sterilization with your caregiver to answer any concerns you or your partner may have. You may want to ask what type of sterilization your caregiver performs. Some caregivers may not perform all the various options. Sterilization is permanent and should only be done if you are sure you do not want children or do not want any more children. Having a sterilization reversed may not be successful.  STERILIZATION PROCEDURES  Laparoscopic sterilization. This is a surgical method performed at a time other than right after childbirth. Two incisions are made in the lower abdomen. A thin, lighted tube (laparoscope) is inserted into one of the incisions and is used to perform the procedure. The fallopian tubes are closed with a ring or a clip. An instrument that uses heat could be used to seal the tubes closed (electrocautery).   Mini-laparotomy. This is a surgical method done 1 or 2 days after giving birth. Typically, a small incision is made just below the belly button (umbilicus) and the fallopian tubes are exposed. The tubes can then be  sealed, tied, or cut.   Hysteroscopic sterilization. This is performed at a time other than right after childbirth. A tiny, spring-like coil is inserted through the cervix and uterus and placed into the fallopian tubes. The coil causes scaring and blocks the tubes. Other forms of contraception should be used for 3 months after the procedure to allow the scar tissue to form completely. Additionally, it is required hysterosalpingography be done 3 months later to ensure that the procedure was successful. Hysterosalpingography is a procedure that uses X-rays to look at your uterus and fallopian tubes after a material to make them show up better has been inserted. IS STERILIZATION SAFE? Sterilization is considered safe with very rare complications. Risks depend on the type of procedure you have. As with any surgical procedure, there are risks. Some risks of sterilization by any means include:   Bleeding.  Infection.  Reaction to anesthesia medicine.  Injury to surrounding organs. Risks specific to having hysteroscopic coils placed include:  The coils may not be placed correctly the first time.   The coils may move out of place.   The tubes may not get completely blocked after 3 months.   Injury to surrounding organs when placing the coil.  HOW EFFECTIVE IS FEMALE STERILIZATION? Sterilization is nearly 100% effective, but it can fail. Depending on the type of sterilization, the rate of failure can be as high as 3%. After hysteroscopic sterilization with placement of fallopian tube coils, you will need back-up birth control for 3 months after the procedure. Sterilization is effective for a lifetime.  BENEFITS OF STERILIZATION  It does   not affect your hormones, and therefore will not affect your menstrual periods, sexual desire, or performance.   It is effective for a lifetime.   It is safe.   You do not need to worry about getting pregnant. Keep in mind that if you had the  hysteroscopic placement procedure, you must wait 3 months after the procedure (or until your caregiver confirms) before pregnancy is not considered possible.   There are no side effects unlike other types of birth control (contraception).  DRAWBACKS OF STERILIZATION  You must be sure you do not want children or any more children. The procedure is permanent.   It does not provide protection against sexually transmitted infections (STIs).   The tubes can grow back together. If this happens, there is a risk of pregnancy. There is also an increased risk (50%) of pregnancy being an ectopic pregnancy. This is a pregnancy that happens outside of the uterus.   This information is not intended to replace advice given to you by your health care provider. Make sure you discuss any questions you have with your health care provider.   Document Released: 03/01/2008 Document Revised: 09/18/2013 Document Reviewed: 12/30/2011 Elsevier Interactive Patient Education [employer].

## 2016-03-24 NOTE — Progress Notes (Signed)
Subjective:  Kristine Beard is a 40 y.o. G3P2002 at [redacted]w[redacted]d being seen today for ongoing prenatal care.  She is currently monitored for the following issues for this high-risk pregnancy and has Previous cesarean delivery, antepartum; Maternal anemia complicating pregnancy, childbirth, or the puerperium; Supervision of other normal pregnancy, antepartum; AMA (advanced maternal age) multigravida 49+; and Pregnancy with history of uterine myomectomy on her problem list.  Patient reports no complaints.  Contractions: Not present. Vag. Bleeding: None.  Movement: Present. Denies leaking of fluid.   The following portions of the patient's history were reviewed and updated as appropriate: allergies, current medications, past family history, past medical history, past social history, past surgical history and problem list. Problem list updated.  Objective:   Filed Vitals:   03/24/16 1050  BP: 113/69  Pulse: 77  Weight: 181 lb (82.101 kg)    Fetal Status: Fetal Heart Rate (bpm): 142 Fundal Height: 32 cm Movement: Present     General:  Alert, oriented and cooperative. Patient is in no acute distress.  Skin: Skin is warm and dry. No rash noted.   Cardiovascular: Normal heart rate noted  Respiratory: Normal respiratory effort, no problems with respiration noted  Abdomen: Soft, gravid, appropriate for gestational age. Pain/Pressure: Present     Pelvic: Cervical exam deferred        Extremities: Normal range of motion.  Edema: Trace  Mental Status: Normal mood and affect. Normal behavior. Normal judgment and thought content.   Urinalysis: Urine Protein: Negative Urine Glucose: Negative  Assessment and Plan:  Pregnancy: G3P2002 at [redacted]w[redacted]d  1. AMA (advanced maternal age) multigravida 35+, third trimester  - CBC - HIV antibody - RPR - Glucose Tolerance, 1 HR (50g)  2. Supervision of other normal pregnancy, antepartum, third trimester  3. Previous cesarean delivery, antepartum  Pt scheduled for  repeat c-section with BTL at 37 weeks   4. Pregnancy with history of uterine myomectomy  Pt scheduled for repeat c-section with BTL at 37 weeks  Preterm labor symptoms and general obstetric precautions including but not limited to vaginal bleeding, contractions, leaking of fluid and fetal movement were reviewed in detail with the patient. Please refer to After Visit Summary for other counseling recommendations.  Return in about 2 weeks (around 04/07/2016).   Lavonia Drafts, MD

## 2016-03-25 ENCOUNTER — Encounter (HOSPITAL_COMMUNITY): Payer: Self-pay | Admitting: *Deleted

## 2016-03-25 LAB — RPR

## 2016-03-25 LAB — GLUCOSE TOLERANCE, 1 HOUR (50G) W/O FASTING: Glucose, 1 Hr, gestational: 113 mg/dL (ref <140)

## 2016-03-25 LAB — HIV ANTIBODY (ROUTINE TESTING W REFLEX): HIV 1&2 Ab, 4th Generation: NONREACTIVE

## 2016-04-12 ENCOUNTER — Ambulatory Visit (INDEPENDENT_AMBULATORY_CARE_PROVIDER_SITE_OTHER): Payer: Self-pay | Admitting: Obstetrics & Gynecology

## 2016-04-12 VITALS — BP 127/78 | HR 82 | Wt 180.0 lb

## 2016-04-12 DIAGNOSIS — Z3483 Encounter for supervision of other normal pregnancy, third trimester: Secondary | ICD-10-CM

## 2016-04-12 DIAGNOSIS — O09523 Supervision of elderly multigravida, third trimester: Secondary | ICD-10-CM

## 2016-04-12 DIAGNOSIS — O34219 Maternal care for unspecified type scar from previous cesarean delivery: Secondary | ICD-10-CM

## 2016-04-12 DIAGNOSIS — O3429 Maternal care due to uterine scar from other previous surgery: Secondary | ICD-10-CM

## 2016-04-12 NOTE — Progress Notes (Signed)
Subjective:  Kristine Beard is a 40 y.o. AA [id number] (daughter)  at [redacted]w[redacted]d being seen today for ongoing prenatal care.  She is currently monitored for the following issues for this high-risk pregnancy and has Previous cesarean delivery, antepartum; Maternal anemia complicating pregnancy, childbirth, or the puerperium; Supervision of other normal pregnancy, antepartum; AMA (advanced maternal age) multigravida 30+; and Pregnancy with history of uterine myomectomy on her problem list.  Patient reports no complaints.  Contractions: Not present. Vag. Bleeding: None.  Movement: Present. Denies leaking of fluid.   The following portions of the patient's history were reviewed and updated as appropriate: allergies, current medications, past family history, past medical history, past social history, past surgical history and problem list. Problem list updated.  Objective:   Filed Vitals:   04/12/16 1355  BP: 127/78  Pulse: 82  Weight: 180 lb (81.647 kg)    Fetal Status: Fetal Heart Rate (bpm): 141 Fundal Height: 35 cm Movement: Present     General:  Alert, oriented and cooperative. Patient is in no acute distress.  Skin: Skin is warm and dry. No rash noted.   Cardiovascular: Normal heart rate noted  Respiratory: Normal respiratory effort, no problems with respiration noted  Abdomen: Soft, gravid, appropriate for gestational age. Pain/Pressure: Present     Pelvic:  Cervical exam deferred        Extremities: Normal range of motion.  Edema: None  Mental Status: Normal mood and affect. Normal behavior. Normal judgment and thought content.   Urinalysis: Urine Protein: Negative Urine Glucose: Negative  Assessment and Plan:  Pregnancy: G3P2002 at [redacted]w[redacted]d  1. AMA (advanced maternal age) multigravida 90+, third trimester   2. Pregnancy with history of uterine myomectomy   3. Previous cesarean delivery, antepartum   4. Supervision of other normal pregnancy, antepartum, third trimester   Preterm  labor symptoms and general obstetric precautions including but not limited to vaginal bleeding, contractions, leaking of fluid and fetal movement were reviewed in detail with the patient. Please refer to After Visit Summary for other counseling recommendations.  Return in about 2 weeks (around 04/26/2016).   Emily Filbert, MD

## 2016-04-27 ENCOUNTER — Ambulatory Visit (INDEPENDENT_AMBULATORY_CARE_PROVIDER_SITE_OTHER): Payer: Self-pay | Admitting: Family Medicine

## 2016-04-27 VITALS — BP 123/79 | HR 94 | Wt 184.0 lb

## 2016-04-27 DIAGNOSIS — O34219 Maternal care for unspecified type scar from previous cesarean delivery: Secondary | ICD-10-CM

## 2016-04-27 DIAGNOSIS — Z3483 Encounter for supervision of other normal pregnancy, third trimester: Secondary | ICD-10-CM

## 2016-04-27 NOTE — Patient Instructions (Signed)
Third Trimester of Pregnancy The third trimester is from week 29 through week 42, months 7 through 9. The third trimester is a time when the fetus is growing rapidly. At the end of the ninth month, the fetus is about 20 inches in length and weighs 6-10 pounds.  BODY CHANGES Your body goes through many changes during pregnancy. The changes vary from woman to woman.   Your weight will continue to increase. You can expect to gain 25-35 pounds (11-16 kg) by the end of the pregnancy.  You may begin to get stretch marks on your hips, abdomen, and breasts.  You may urinate more often because the fetus is moving lower into your pelvis and pressing on your bladder.  You may develop or continue to have heartburn as a result of your pregnancy.  You may develop constipation because certain hormones are causing the muscles that push waste through your intestines to slow down.  You may develop hemorrhoids or swollen, bulging veins (varicose veins).  You may have pelvic pain because of the weight gain and pregnancy hormones relaxing your joints between the bones in your pelvis. Backaches may result from overexertion of the muscles supporting your posture.  You may have changes in your hair. These can include thickening of your hair, rapid growth, and changes in texture. Some women also have hair loss during or after pregnancy, or hair that feels dry or thin. Your hair will most likely return to normal after your baby is born.  Your breasts will continue to grow and be tender. A yellow discharge may leak from your breasts called colostrum.  Your belly button may stick out.  You may feel short of breath because of your expanding uterus.  You may notice the fetus "dropping," or moving lower in your abdomen.  You may have a bloody mucus discharge. This usually occurs a few days to a week before labor begins.  Your cervix becomes thin and soft (effaced) near your due date. WHAT TO EXPECT AT YOUR  PRENATAL EXAMS  You will have prenatal exams every 2 weeks until week 36. Then, you will have weekly prenatal exams. During a routine prenatal visit:  You will be weighed to make sure you and the fetus are growing normally.  Your blood pressure is taken.  Your abdomen will be measured to track your baby's growth.  The fetal heartbeat will be listened to.  Any test results from the previous visit will be discussed.  You may have a cervical check near your due date to see if you have effaced. At around 36 weeks, your caregiver will check your cervix. At the same time, your caregiver will also perform a test on the secretions of the vaginal tissue. This test is to determine if a type of bacteria, Group B streptococcus, is present. Your caregiver will explain this further. Your caregiver may ask you:  What your birth plan is.  How you are feeling.  If you are feeling the baby move.  If you have had any abnormal symptoms, such as leaking fluid, bleeding, severe headaches, or abdominal cramping.  If you are using any tobacco products, including cigarettes, chewing tobacco, and electronic cigarettes.  If you have any questions. Other tests or screenings that may be performed during your third trimester include:  Blood tests that check for low iron levels (anemia).  Fetal testing to check the health, activity level, and growth of the fetus. Testing is done if you have certain medical conditions or if   there are problems during the pregnancy.  HIV (human immunodeficiency virus) testing. If you are at high risk, you may be screened for HIV during your third trimester of pregnancy. FALSE LABOR You may feel small, irregular contractions that eventually go away. These are called Braxton Hicks contractions, or false labor. Contractions may last for hours, days, or even weeks before true labor sets in. If contractions come at regular intervals, intensify, or become painful, it is best to be seen  by your caregiver.  SIGNS OF LABOR   Menstrual-like cramps.  Contractions that are 5 minutes apart or less.  Contractions that start on the top of the uterus and spread down to the lower abdomen and back.  A sense of increased pelvic pressure or back pain.  A watery or bloody mucus discharge that comes from the vagina. If you have any of these signs before the 37th week of pregnancy, call your caregiver right away. You need to go to the hospital to get checked immediately. HOME CARE INSTRUCTIONS   Avoid all smoking, herbs, alcohol, and unprescribed drugs. These chemicals affect the formation and growth of the baby.  Do not use any tobacco products, including cigarettes, chewing tobacco, and electronic cigarettes. If you need help quitting, ask your health care provider. You may receive counseling support and other resources to help you quit.  Follow your caregiver's instructions regarding medicine use. There are medicines that are either safe or unsafe to take during pregnancy.  Exercise only as directed by your caregiver. Experiencing uterine cramps is a good sign to stop exercising.  Continue to eat regular, healthy meals.  Wear a good support bra for breast tenderness.  Do not use hot tubs, steam rooms, or saunas.  Wear your seat belt at all times when driving.  Avoid raw meat, uncooked cheese, cat litter boxes, and soil used by cats. These carry germs that can cause birth defects in the baby.  Take your prenatal vitamins.  Take 1500-2000 mg of calcium daily starting at the 20th week of pregnancy until you deliver your baby.  Try taking a stool softener (if your caregiver approves) if you develop constipation. Eat more high-fiber foods, such as fresh vegetables or fruit and whole grains. Drink plenty of fluids to keep your urine clear or pale yellow.  Take warm sitz baths to soothe any pain or discomfort caused by hemorrhoids. Use hemorrhoid cream if your caregiver  approves.  If you develop varicose veins, wear support hose. Elevate your feet for 15 minutes, 3-4 times a day. Limit salt in your diet.  Avoid heavy lifting, wear low heal shoes, and practice good posture.  Rest a lot with your legs elevated if you have leg cramps or low back pain.  Visit your dentist if you have not gone during your pregnancy. Use a soft toothbrush to brush your teeth and be gentle when you floss.  A sexual relationship may be continued unless your caregiver directs you otherwise.  Do not travel far distances unless it is absolutely necessary and only with the approval of your caregiver.  Take prenatal classes to understand, practice, and ask questions about the labor and delivery.  Make a trial run to the hospital.  Pack your hospital bag.  Prepare the baby's nursery.  Continue to go to all your prenatal visits as directed by your caregiver. SEEK MEDICAL CARE IF:  You are unsure if you are in labor or if your water has broken.  You have dizziness.  You have   mild pelvic cramps, pelvic pressure, or nagging pain in your abdominal area.  You have persistent nausea, vomiting, or diarrhea.  You have a bad smelling vaginal discharge.  You have pain with urination. SEEK IMMEDIATE MEDICAL CARE IF:   You have a fever.  You are leaking fluid from your vagina.  You have spotting or bleeding from your vagina.  You have severe abdominal cramping or pain.  You have rapid weight loss or gain.  You have shortness of breath with chest pain.  You notice sudden or extreme swelling of your face, hands, ankles, feet, or legs.  You have not felt your baby move in over an hour.  You have severe headaches that do not go away with medicine.  You have vision changes.   This information is not intended to replace advice given to you by your health care provider. Make sure you discuss any questions you have with your health care provider.   Document Released:  09/07/2001 Document Revised: 10/04/2014 Document Reviewed: 11/14/2012 Elsevier Interactive Patient Education 2016 [employer]  Breastfeeding Deciding to breastfeed is one of the best choices you can make for you and your baby. A change in hormones during pregnancy causes your breast tissue to grow and increases the number and size of your milk ducts. These hormones also allow proteins, sugars, and fats from your blood supply to make breast milk in your milk-producing glands. Hormones prevent breast milk from being released before your baby is born as well as prompt milk flow after birth. Once breastfeeding has begun, thoughts of your baby, as well as his or her sucking or crying, can stimulate the release of milk from your milk-producing glands.  BENEFITS OF BREASTFEEDING For Your Baby  Your first milk (colostrum) helps your baby's digestive system function better.  There are antibodies in your milk that help your baby fight off infections.  Your baby has a lower incidence of asthma, allergies, and sudden infant death syndrome.  The nutrients in breast milk are better for your baby than infant formulas and are designed uniquely for your baby's needs.  Breast milk improves your baby's brain development.  Your baby is less likely to develop other conditions, such as childhood obesity, asthma, or type 2 diabetes mellitus. For You  Breastfeeding helps to create a very special bond between you and your baby.  Breastfeeding is convenient. Breast milk is always available at the correct temperature and costs nothing.  Breastfeeding helps to burn calories and helps you lose the weight gained during pregnancy.  Breastfeeding makes your uterus contract to its prepregnancy size faster and slows bleeding (lochia) after you give birth.   Breastfeeding helps to lower your risk of developing type 2 diabetes mellitus, osteoporosis, and breast or ovarian cancer later in life. SIGNS THAT YOUR BABY IS  HUNGRY Early Signs of Hunger  Increased alertness or activity.  Stretching.  Movement of the head from side to side.  Movement of the head and opening of the mouth when the corner of the mouth or cheek is stroked (rooting).  Increased sucking sounds, smacking lips, cooing, sighing, or squeaking.  Hand-to-mouth movements.  Increased sucking of fingers or hands. Late Signs of Hunger  Fussing.  Intermittent crying. Extreme Signs of Hunger Signs of extreme hunger will require calming and consoling before your baby will be able to breastfeed successfully. Do not wait for the following signs of extreme hunger to occur before you initiate breastfeeding:  Restlessness.  A loud, strong cry.  Screaming.   BREASTFEEDING BASICS Breastfeeding Initiation  Find a comfortable place to sit or lie down, with your neck and back well supported.  Place a pillow or rolled up blanket under your baby to bring him or her to the level of your breast (if you are seated). Nursing pillows are specially designed to help support your arms and your baby while you breastfeed.  Make sure that your baby's abdomen is facing your abdomen.  Gently massage your breast. With your fingertips, massage from your chest wall toward your nipple in a circular motion. This encourages milk flow. You may need to continue this action during the feeding if your milk flows slowly.  Support your breast with 4 fingers underneath and your thumb above your nipple. Make sure your fingers are well away from your nipple and your baby's mouth.  Stroke your baby's lips gently with your finger or nipple.  When your baby's mouth is open wide enough, quickly bring your baby to your breast, placing your entire nipple and as much of the colored area around your nipple (areola) as possible into your baby's mouth.  More areola should be visible above your baby's upper lip than below the lower lip.  Your baby's tongue should be between his  or her lower gum and your breast.  Ensure that your baby's mouth is correctly positioned around your nipple (latched). Your baby's lips should create a seal on your breast and be turned out (everted).  It is common for your baby to suck about 2-3 minutes in order to start the flow of breast milk. Latching Teaching your baby how to latch on to your breast properly is very important. An improper latch can cause nipple pain and decreased milk supply for you and poor weight gain in your baby. Also, if your baby is not latched onto your nipple properly, he or she may swallow some air during feeding. This can make your baby fussy. Burping your baby when you switch breasts during the feeding can help to get rid of the air. However, teaching your baby to latch on properly is still the best way to prevent fussiness from swallowing air while breastfeeding. Signs that your baby has successfully latched on to your nipple:  Silent tugging or silent sucking, without causing you pain.  Swallowing heard between every 3-4 sucks.  Muscle movement above and in front of his or her ears while sucking. Signs that your baby has not successfully latched on to nipple:  Sucking sounds or smacking sounds from your baby while breastfeeding.  Nipple pain. If you think your baby has not latched on correctly, slip your finger into the corner of your baby's mouth to break the suction and place it between your baby's gums. Attempt breastfeeding initiation again. Signs of Successful Breastfeeding Signs from your baby:  A gradual decrease in the number of sucks or complete cessation of sucking.  Falling asleep.  Relaxation of his or her body.  Retention of a small amount of milk in his or her mouth.  Letting go of your breast by himself or herself. Signs from you:  Breasts that have increased in firmness, weight, and size 1-3 hours after feeding.  Breasts that are softer immediately after  breastfeeding.  Increased milk volume, as well as a change in milk consistency and color by the fifth day of breastfeeding.  Nipples that are not sore, cracked, or bleeding. Signs That Your Baby is Getting Enough Milk  Wetting at least 3 diapers in a 24-hour period.   The urine should be clear and pale yellow by age 5 days.  At least 3 stools in a 24-hour period by age 5 days. The stool should be soft and yellow.  At least 3 stools in a 24-hour period by age 7 days. The stool should be seedy and yellow.  No loss of weight greater than 10% of birth weight during the first 3 days of age.  Average weight gain of 4-7 ounces (113-198 g) per week after age 4 days.  Consistent daily weight gain by age 5 days, without weight loss after the age of 2 weeks. After a feeding, your baby may spit up a small amount. This is common. BREASTFEEDING FREQUENCY AND DURATION Frequent feeding will help you make more milk and can prevent sore nipples and breast engorgement. Breastfeed when you feel the need to reduce the fullness of your breasts or when your baby shows signs of hunger. This is called "breastfeeding on demand." Avoid introducing a pacifier to your baby while you are working to establish breastfeeding (the first 4-6 weeks after your baby is born). After this time you may choose to use a pacifier. Research has shown that pacifier use during the first year of a baby's life decreases the risk of sudden infant death syndrome (SIDS). Allow your baby to feed on each breast as long as he or she wants. Breastfeed until your baby is finished feeding. When your baby unlatches or falls asleep while feeding from the first breast, offer the second breast. Because newborns are often sleepy in the first few weeks of life, you may need to awaken your baby to get him or her to feed. Breastfeeding times will vary from baby to baby. However, the following rules can serve as a guide to help you ensure that your baby is  properly fed:  Newborns (babies 4 weeks of age or younger) may breastfeed every 1-3 hours.  Newborns should not go longer than 3 hours during the day or 5 hours during the night without breastfeeding.  You should breastfeed your baby a minimum of 8 times in a 24-hour period until you begin to introduce solid foods to your baby at around 6 months of age. BREAST MILK PUMPING Pumping and storing breast milk allows you to ensure that your baby is exclusively fed your breast milk, even at times when you are unable to breastfeed. This is especially important if you are going back to work while you are still breastfeeding or when you are not able to be present during feedings. Your lactation consultant can give you guidelines on how long it is safe to store breast milk. A breast pump is a machine that allows you to pump milk from your breast into a sterile bottle. The pumped breast milk can then be stored in a refrigerator or freezer. Some breast pumps are operated by hand, while others use electricity. Ask your lactation consultant which type will work best for you. Breast pumps can be purchased, but some hospitals and breastfeeding support groups lease breast pumps on a monthly basis. A lactation consultant can teach you how to hand express breast milk, if you prefer not to use a pump. CARING FOR YOUR BREASTS WHILE YOU BREASTFEED Nipples can become dry, cracked, and sore while breastfeeding. The following recommendations can help keep your breasts moisturized and healthy:  Avoid using soap on your nipples.  Wear a supportive bra. Although not required, special nursing bras and tank tops are designed to allow access to your   breasts for breastfeeding without taking off your entire bra or top. Avoid wearing underwire-style bras or extremely tight bras.  Air dry your nipples for 3-4minutes after each feeding.  Use only cotton bra pads to absorb leaked breast milk. Leaking of breast milk between feedings  is normal.  Use lanolin on your nipples after breastfeeding. Lanolin helps to maintain your skin's normal moisture barrier. If you use pure lanolin, you do not need to wash it off before feeding your baby again. Pure lanolin is not toxic to your baby. You may also hand express a few drops of breast milk and gently massage that milk into your nipples and allow the milk to air dry. In the first few weeks after giving birth, some women experience extremely full breasts (engorgement). Engorgement can make your breasts feel heavy, warm, and tender to the touch. Engorgement peaks within 3-5 days after you give birth. The following recommendations can help ease engorgement:  Completely empty your breasts while breastfeeding or pumping. You may want to start by applying warm, moist heat (in the shower or with warm water-soaked hand towels) just before feeding or pumping. This increases circulation and helps the milk flow. If your baby does not completely empty your breasts while breastfeeding, pump any extra milk after he or she is finished.  Wear a snug bra (nursing or regular) or tank top for 1-2 days to signal your body to slightly decrease milk production.  Apply ice packs to your breasts, unless this is too uncomfortable for you.  Make sure that your baby is latched on and positioned properly while breastfeeding. If engorgement persists after 48 hours of following these recommendations, contact your health care provider or a lactation consultant. OVERALL HEALTH CARE RECOMMENDATIONS WHILE BREASTFEEDING  Eat healthy foods. Alternate between meals and snacks, eating 3 of each per day. Because what you eat affects your breast milk, some of the foods may make your baby more irritable than usual. Avoid eating these foods if you are sure that they are negatively affecting your baby.  Drink milk, fruit juice, and water to satisfy your thirst (about 10 glasses a day).  Rest often, relax, and continue to take  your prenatal vitamins to prevent fatigue, stress, and anemia.  Continue breast self-awareness checks.  Avoid chewing and smoking tobacco. Chemicals from cigarettes that pass into breast milk and exposure to secondhand smoke may harm your baby.  Avoid alcohol and drug use, including marijuana. Some medicines that may be harmful to your baby can pass through breast milk. It is important to ask your health care provider before taking any medicine, including all over-the-counter and prescription medicine as well as vitamin and herbal supplements. It is possible to become pregnant while breastfeeding. If birth control is desired, ask your health care provider about options that will be safe for your baby. SEEK MEDICAL CARE IF:  You feel like you want to stop breastfeeding or have become frustrated with breastfeeding.  You have painful breasts or nipples.  Your nipples are cracked or bleeding.  Your breasts are red, tender, or warm.  You have a swollen area on either breast.  You have a fever or chills.  You have nausea or vomiting.  You have drainage other than breast milk from your nipples.  Your breasts do not become full before feedings by the fifth day after you give birth.  You feel sad and depressed.  Your baby is too sleepy to eat well.  Your baby is having trouble sleeping.     Your baby is wetting less than 3 diapers in a 24-hour period.  Your baby has less than 3 stools in a 24-hour period.  Your baby's skin or the white part of his or her eyes becomes yellow.   Your baby is not gaining weight by 5 days of age. SEEK IMMEDIATE MEDICAL CARE IF:  Your baby is overly tired (lethargic) and does not want to wake up and feed.  Your baby develops an unexplained fever.   This information is not intended to replace advice given to you by your health care provider. Make sure you discuss any questions you have with your health care provider.   Document Released: 09/13/2005  Document Revised: 06/04/2015 Document Reviewed: 03/07/2013 Elsevier Interactive Patient Education 2016 [employer]  

## 2016-04-27 NOTE — Progress Notes (Signed)
Subjective:  Kristine Beard is a 40 y.o. G3P2002 at [redacted]w[redacted]d being seen today for ongoing prenatal care.  She is currently monitored for the following issues for this high-risk pregnancy and has Previous cesarean delivery, antepartum; Maternal anemia complicating pregnancy, childbirth, or the puerperium; Supervision of other normal pregnancy, antepartum; AMA (advanced maternal age) multigravida 63+; and Pregnancy with history of uterine myomectomy on her problem list.  Patient reports no complaints.  Contractions: Not present. Vag. Bleeding: None.  Movement: Present. Denies leaking of fluid.   The following portions of the patient's history were reviewed and updated as appropriate: allergies, current medications, past family history, past medical history, past social history, past surgical history and problem list. Problem list updated.  Objective:   Vitals:   04/27/16 1054  BP: 123/79  Pulse: 94  Weight: 184 lb (83.5 kg)    Fetal Status: Fetal Heart Rate (bpm): 145 Fundal Height: 35 cm Movement: Present     General:  Alert, oriented and cooperative. Patient is in no acute distress.  Skin: Skin is warm and dry. No rash noted.   Cardiovascular: Normal heart rate noted  Respiratory: Normal respiratory effort, no problems with respiration noted  Abdomen: Soft, gravid, appropriate for gestational age. Pain/Pressure: Present     Pelvic:  Cervical exam deferred        Extremities: Normal range of motion.  Edema: Trace  Mental Status: Normal mood and affect. Normal behavior. Normal judgment and thought content.   Urinalysis: Urine Protein: Negative Urine Glucose: Negative  Assessment and Plan:  Pregnancy: G3P2002 at [redacted]w[redacted]d  1. Supervision of other normal pregnancy, antepartum, third trimester Continue routine prenatal care.   2. Previous cesarean delivery, antepartum Scheduled for 8/29  Preterm labor symptoms and general obstetric precautions including but not limited to vaginal bleeding,  contractions, leaking of fluid and fetal movement were reviewed in detail with the patient. Please refer to After Visit Summary for other counseling recommendations.  Return in 2 weeks (on 05/11/2016).   Donnamae Jude, MD

## 2016-05-10 ENCOUNTER — Ambulatory Visit (INDEPENDENT_AMBULATORY_CARE_PROVIDER_SITE_OTHER): Payer: Self-pay | Admitting: Obstetrics and Gynecology

## 2016-05-10 VITALS — BP 130/78 | HR 84 | Wt 188.0 lb

## 2016-05-10 DIAGNOSIS — Z113 Encounter for screening for infections with a predominantly sexual mode of transmission: Secondary | ICD-10-CM

## 2016-05-10 DIAGNOSIS — Z3483 Encounter for supervision of other normal pregnancy, third trimester: Secondary | ICD-10-CM

## 2016-05-10 DIAGNOSIS — O34219 Maternal care for unspecified type scar from previous cesarean delivery: Secondary | ICD-10-CM

## 2016-05-10 DIAGNOSIS — O09523 Supervision of elderly multigravida, third trimester: Secondary | ICD-10-CM

## 2016-05-10 DIAGNOSIS — O3429 Maternal care due to uterine scar from other previous surgery: Secondary | ICD-10-CM

## 2016-05-10 LAB — OB RESULTS CONSOLE GC/CHLAMYDIA: GC PROBE AMP, GENITAL: NEGATIVE

## 2016-05-10 LAB — OB RESULTS CONSOLE GBS: STREP GROUP B AG: POSITIVE

## 2016-05-10 NOTE — Progress Notes (Signed)
Subjective:  Kristine Beard is a 40 y.o. G3P2002 at [redacted]w[redacted]d being seen today for ongoing prenatal care.  She is currently monitored for the following issues for this high-risk pregnancy and has Previous cesarean delivery, antepartum; Maternal anemia complicating pregnancy, childbirth, or the puerperium; Supervision of other normal pregnancy, antepartum; AMA (advanced maternal age) multigravida 39+; and Pregnancy with history of uterine myomectomy on her problem list.  Patient reports no complaints.  Contractions: Not present. Vag. Bleeding: None.  Movement: Present. Denies leaking of fluid.   The following portions of the patient's history were reviewed and updated as appropriate: allergies, current medications, past family history, past medical history, past social history, past surgical history and problem list. Problem list updated.  Objective:   Vitals:   05/10/16 1332  BP: 130/78  Pulse: 84  Weight: 188 lb (85.3 kg)    Fetal Status: Fetal Heart Rate (bpm): 132 Fundal Height: 36 cm Movement: Present  Presentation: Vertex  General:  Alert, oriented and cooperative. Patient is in no acute distress.  Skin: Skin is warm and dry. No rash noted.   Cardiovascular: Normal heart rate noted  Respiratory: Normal respiratory effort, no problems with respiration noted  Abdomen: Soft, gravid, appropriate for gestational age. Pain/Pressure: Present     Pelvic:  Cervical exam performed Dilation: Fingertip Effacement (%): Thick Station: Ballotable  Extremities: Normal range of motion.  Edema: Trace  Mental Status: Normal mood and affect. Normal behavior. Normal judgment and thought content.   Urinalysis: Urine Protein: Negative Urine Glucose: Negative  Assessment and Plan:  Pregnancy: G3P2002 at [redacted]w[redacted]d  1. Supervision of other normal pregnancy, antepartum, third trimester Patient is doing well without complaints Cultures collected - Culture, beta strep (group b only) - Urine cytology ancillary  only  2. Previous cesarean delivery, antepartum Patient scheduled for repeat with BTL on 8/29  3. AMA (advanced maternal age) multigravida 61+, third trimester   Preterm labor symptoms and general obstetric precautions including but not limited to vaginal bleeding, contractions, leaking of fluid and fetal movement were reviewed in detail with the patient. Please refer to After Visit Summary for other counseling recommendations.  Return in about 1 week (around 05/17/2016).   Mora Bellman, MD

## 2016-05-11 LAB — URINE CYTOLOGY ANCILLARY ONLY
CHLAMYDIA, DNA PROBE: NEGATIVE
Neisseria Gonorrhea: NEGATIVE

## 2016-05-11 LAB — CULTURE, BETA STREP (GROUP B ONLY)

## 2016-05-12 ENCOUNTER — Encounter: Payer: Self-pay | Admitting: Obstetrics and Gynecology

## 2016-05-12 DIAGNOSIS — O99820 Streptococcus B carrier state complicating pregnancy: Secondary | ICD-10-CM | POA: Insufficient documentation

## 2016-05-13 ENCOUNTER — Telehealth: Payer: Self-pay | Admitting: *Deleted

## 2016-05-13 ENCOUNTER — Encounter: Payer: Self-pay | Admitting: *Deleted

## 2016-05-13 NOTE — Telephone Encounter (Signed)
Pt states she has been experiencing pain on and off on her right side that comes and goes.  Denies any vaginal bleeding, leaking of fluid, and states the baby is moving well.  Her last day of work is next Wednesday and was requesting to be written out of work starting today due to the discomfort associated with the pregnancy.  Will pick up letter on Tuesday at [facility] appt. Reviewed labor precautions with the patient.

## 2016-05-13 NOTE — Telephone Encounter (Signed)
-----   Message from Francia Greaves sent at 05/13/2016  9:49 AM EDT ----- Regarding: Advise Contact: [phone] Having so sharp pains, no constant, comes and goes, wants to know if she can be written out of work

## 2016-05-17 ENCOUNTER — Telehealth (HOSPITAL_COMMUNITY): Payer: Self-pay | Admitting: *Deleted

## 2016-05-17 NOTE — Telephone Encounter (Signed)
Preadmission screen  

## 2016-05-18 ENCOUNTER — Telehealth (HOSPITAL_COMMUNITY): Payer: Self-pay | Admitting: *Deleted

## 2016-05-18 ENCOUNTER — Ambulatory Visit (INDEPENDENT_AMBULATORY_CARE_PROVIDER_SITE_OTHER): Payer: Self-pay | Admitting: Obstetrics & Gynecology

## 2016-05-18 VITALS — BP 134/80 | HR 76 | Wt 189.0 lb

## 2016-05-18 DIAGNOSIS — Z3483 Encounter for supervision of other normal pregnancy, third trimester: Secondary | ICD-10-CM

## 2016-05-18 DIAGNOSIS — Z22330 Carrier of Group B streptococcus: Secondary | ICD-10-CM

## 2016-05-18 DIAGNOSIS — O99820 Streptococcus B carrier state complicating pregnancy: Secondary | ICD-10-CM

## 2016-05-18 DIAGNOSIS — O34219 Maternal care for unspecified type scar from previous cesarean delivery: Secondary | ICD-10-CM

## 2016-05-18 DIAGNOSIS — O3429 Maternal care due to uterine scar from other previous surgery: Secondary | ICD-10-CM

## 2016-05-18 DIAGNOSIS — O09523 Supervision of elderly multigravida, third trimester: Secondary | ICD-10-CM

## 2016-05-18 NOTE — Patient Instructions (Signed)
Cesarean Delivery Cesarean delivery is the birth of a baby through a cut (incision) in the abdomen and womb (uterus).  LET YOUR HEALTH CARE PROVIDER KNOW ABOUT:  All medicines you are taking, including vitamins, herbs, eye drops, creams, and over-the-counter medicines.  Previous problems you or members of your family have had with the use of anesthetics.  Any bleeding or blood clotting disorders you have.  Family history of blood clots or bleeding disorders.  Any history of deep vein thrombosis (DVT) or pulmonary embolism (PE).  Previous surgeries you have had.  Medical conditions you have.  Any allergies you have.  Complicationsinvolving the pregnancy. RISKS AND COMPLICATIONS  Generally, this is a safe procedure. However, as with any procedure, complications can occur. Possible complications include:  Bleeding.  Infection.  Blood clots.  Injury to surrounding organs.  Problems with anesthesia.  Injury to the baby. BEFORE THE PROCEDURE   You may be given an antacid medicine to drink. This will prevent acid contents in your stomach from going into your lungs if you vomit during the surgery.  You may be given an antibiotic medicine to prevent infection. PROCEDURE   To prevent infection of your incision:  Hair may be removed from your pubic area if it is near your incision.  The skin of your pubic area and lower abdomen will be cleaned with a germ-killing solution (antiseptic).  A tube (Foley catheter) will be placed in your bladder to drain your urine from your bladder into a bag. This keeps your bladder empty during surgery.  An IV tube will be placed in your vein.  You may be given medicine to numb the lower half of your body (regional anesthetic). If you were in labor, you may have already had an epidural in place which can be used in both labor and cesarean delivery. You may possibly be given medicine to make you sleep (general anesthetic) though this is not as  common.  Your heart rate and your baby's heart rate will be monitored.  An incision will be made in your abdomen that extends to your uterus. There are 2 basic kinds of incisions:  The horizontal (transverse) incision. Horizontal incisions are from side to side and are used for most routine cesarean deliveries.  The vertical incision. The vertical incision is from the top of the abdomen to the bottom and is less commonly used. It is often done for women who have a serious complication (extreme prematurity) or under emergency situations.  The horizontal and vertical incisions may both be used at the same time. However, this is very uncommon.  An incision is then made in your uterus to deliver the baby.  Your baby will be delivered.  Your health care provider may place the baby on your chest. It is important to keep the baby warm. Your health care provider will dry off the baby, place the baby directly on your bare skin, and cover the baby with warm, dry blankets.  Both incisions will be closed with absorbable stitches. AFTER THE PROCEDURE   If you were awake during the surgery, you will see your baby right away. If you were asleep, you will see your baby as soon as you are awake.  You may breastfeed your baby after surgery.  You may be able to get up and walk the same day as the surgery. If you need to stay in bed for a period of time, you will receive help to turn, cough, and take deep breaths after   surgery. This helps prevent lung problems such as pneumonia.  Do not get out of bed alone the first time after surgery. You will need help getting out of bed until you are able to do this by yourself.  You may be able to shower the day after your cesarean delivery. After the bandage (dressing) is taken off the incision site, a nurse will assist you to shower if you would like help.  You may be directed to take actions to help prevent blood clots in your legs. These may  include:  Walking shortly after surgery, with someone assisting you. Moving around after surgery helps to improve blood flow.  Wearing compression stockings or using different types of devices.  Taking medicines to thin your blood (anticoagulants) if you are at high risk for DVT or PE.  Save any blood clots that you pass from your vagina. If you pass a clot while on the toilet, do not flush it. Call for the nurse. Tell the nurse if you think you are bleeding too much or passing too many clots.  You will be given medicine for pain and nausea as needed. Let your health care providers know if you are hurting. You may also be given an antibiotic to prevent an infection.  Your IV tube will be taken out when you are drinking a reasonable amount of fluids. The Foley catheter is taken out when you are up and walking.  If your blood type is Rh negative and your baby's blood type is Rh positive, you will be given a shot of anti-D immune globulin. This shot prevents you from having Rh problems with a future pregnancy. You should get the shot even if you had your tubes tied (tubal ligation).  If you are allowed to take the baby for a walk, place the baby in the bassinet and push it.   This information is not intended to replace advice given to you by your health care provider. Make sure you discuss any questions you have with your health care provider.   Document Released: 09/13/2005 Document Revised: 06/04/2015 Document Reviewed: 05/10/2012 Elsevier Interactive Patient Education 2016 Elsevier Inc.  

## 2016-05-18 NOTE — Progress Notes (Signed)
Subjective:  Kristine Beard is a 40 y.o. G3P2002 at [redacted]w[redacted]d being seen today for ongoing prenatal care.  She is currently monitored for the following issues for this high-risk pregnancy and has Previous cesarean delivery, antepartum; Maternal anemia complicating pregnancy, childbirth, or the puerperium; Supervision of other normal pregnancy, antepartum; AMA (advanced maternal age) multigravida 35+; Pregnancy with history of uterine myomectomy; and GBS (group B Streptococcus carrier), +RV culture, currently pregnant on her problem list.  Patient reports pain over symphysis pubis.  Contractions: Irritability. Vag. Bleeding: None.  Movement: Present. Denies leaking of fluid.   The following portions of the patient's history were reviewed and updated as appropriate: allergies, current medications, past family history, past medical history, past social history, past surgical history and problem list. Problem list updated.  Objective:   Vitals:   05/18/16 1059  BP: 134/80  Pulse: 76  Weight: 189 lb (85.7 kg)    Fetal Status: Fetal Heart Rate (bpm): 149   Movement: Present     General:  Alert, oriented and cooperative. Patient is in no acute distress.  Skin: Skin is warm and dry. No rash noted.   Cardiovascular: Normal heart rate noted  Respiratory: Normal respiratory effort, no problems with respiration noted  Abdomen: Soft, gravid, appropriate for gestational age. Pain/Pressure: Present. tenerness to palpation over symphysis pubis.  Incision intact and nontender.  No uterine tenderness .     Pelvic:  Cervical exam performed        Extremities: Normal range of motion.  Edema: Trace  Mental Status: Normal mood and affect. Normal behavior. Normal judgment and thought content.   Urinalysis: Urine Protein: Trace Urine Glucose: Negative  Assessment and Plan:  Pregnancy: G3P2002 at [redacted]w[redacted]d  1. Supervision of other normal pregnancy, antepartum, third trimester  2. Previous cesarean delivery,  antepartum Pt counseled to be NPO 8 hours prior to procedure She was instructed to keep her preop appt at the PAT   3. Pregnancy with history of uterine myomectomy Scheduled for repeat c-section at 38 weeks   4. GBS (group B Streptococcus carrier), +RV culture, currently pregnant  5. AMA (advanced maternal age) multigravida 41+, third trimester  Term labor symptoms and general obstetric precautions including but not limited to vaginal bleeding, contractions, leaking of fluid and fetal movement were reviewed in detail with the patient. Please refer to After Visit Summary for other counseling recommendations.  Return in about 3 weeks (around 06/08/2016).   Lavonia Drafts, MD

## 2016-05-18 NOTE — Telephone Encounter (Signed)
Preadmission screen  

## 2016-05-19 ENCOUNTER — Encounter (HOSPITAL_COMMUNITY): Payer: Self-pay

## 2016-05-19 ENCOUNTER — Telehealth (HOSPITAL_COMMUNITY): Payer: Self-pay | Admitting: *Deleted

## 2016-05-19 NOTE — Telephone Encounter (Signed)
Preadmission screen  

## 2016-05-24 ENCOUNTER — Encounter (HOSPITAL_COMMUNITY)
Admission: RE | Admit: 2016-05-24 | Discharge: 2016-05-24 | Disposition: A | Discharge status: Home / Self Care | Payer: Medicaid Other | Source: Ambulatory Visit | Attending: Obstetrics and Gynecology | Admitting: Obstetrics and Gynecology

## 2016-05-24 ENCOUNTER — Encounter (INDEPENDENT_AMBULATORY_CARE_PROVIDER_SITE_OTHER): Payer: Self-pay | Admitting: *Deleted

## 2016-05-24 DIAGNOSIS — O34219 Maternal care for unspecified type scar from previous cesarean delivery: Secondary | ICD-10-CM

## 2016-05-24 LAB — CBC
HCT: 26.7 % — ABNORMAL LOW (ref 36.0–46.0)
Hemoglobin: 8.9 g/dL — ABNORMAL LOW (ref 12.0–15.0)
MCH: 29.1 pg (ref 26.0–34.0)
MCHC: 33.3 g/dL (ref 30.0–36.0)
MCV: 87.3 fL (ref 78.0–100.0)
PLATELETS: 204 10*3/uL (ref 150–400)
RBC: 3.06 MIL/uL — ABNORMAL LOW (ref 3.87–5.11)
RDW: 14 % (ref 11.5–15.5)
WBC: 5.6 10*3/uL (ref 4.0–10.5)

## 2016-05-24 NOTE — Patient Instructions (Signed)
[place] Dosu  05/24/2016   Your procedure is scheduled on:  05/25/2016  Enter through the Main Entrance of [facility] at *[place] up the phone at the desk and dial [phone].   Call this number if you have problems the morning of surgery: [phone]   Remember:   Do not eat food:After Midnight.  Do not drink clear liquids: After Midnight.  Take these medicines the morning of surgery with A SIP OF WATER: na   Do not wear jewelry, make-up or nail polish.  Do not wear lotions, powders, or perfumes. Do not wear deodorant.  Do not shave 48 hours prior to surgery.  Do not bring valuables to the hospital.  [facility] is not   responsible for any belongings or valuables brought to the hospital.  Contacts, dentures or bridgework may not be worn into surgery.  Leave suitcase in the car. After surgery it may be brought to your room.  For patients admitted to the hospital, checkout time is 11:00 AM the day of              discharge.   Patients discharged the day of surgery will not be allowed to drive             home.  Name and phone number of your driver: na  Special Instructions:   N/A   Please read over the following fact sheets that you were given:   Surgical Site Infection Prevention

## 2016-05-25 ENCOUNTER — Inpatient Hospital Stay (HOSPITAL_COMMUNITY)
Admission: RE | Admit: 2016-05-25 | Discharge: 2016-05-28 | DRG: 766 | Disposition: A | Discharge status: Home / Self Care | Payer: Medicaid Other | Source: Ambulatory Visit | Attending: Obstetrics and Gynecology | Admitting: Obstetrics and Gynecology

## 2016-05-25 ENCOUNTER — Inpatient Hospital Stay (HOSPITAL_COMMUNITY): Payer: Medicaid Other | Admitting: Anesthesiology

## 2016-05-25 ENCOUNTER — Encounter (HOSPITAL_COMMUNITY)
Admission: RE | Disposition: A | Discharge status: Home / Self Care | Payer: Self-pay | Source: Ambulatory Visit | Attending: Obstetrics and Gynecology

## 2016-05-25 ENCOUNTER — Encounter (HOSPITAL_COMMUNITY): Payer: Self-pay

## 2016-05-25 DIAGNOSIS — O99824 Streptococcus B carrier state complicating childbirth: Secondary | ICD-10-CM | POA: Diagnosis present

## 2016-05-25 DIAGNOSIS — O9902 Anemia complicating childbirth: Secondary | ICD-10-CM | POA: Diagnosis present

## 2016-05-25 DIAGNOSIS — D649 Anemia, unspecified: Secondary | ICD-10-CM | POA: Diagnosis present

## 2016-05-25 DIAGNOSIS — O34211 Maternal care for low transverse scar from previous cesarean delivery: Principal | ICD-10-CM | POA: Diagnosis present

## 2016-05-25 DIAGNOSIS — Z302 Encounter for sterilization: Secondary | ICD-10-CM | POA: Diagnosis not present

## 2016-05-25 DIAGNOSIS — Z98891 History of uterine scar from previous surgery: Secondary | ICD-10-CM | POA: Diagnosis present

## 2016-05-25 DIAGNOSIS — Z9889 Other specified postprocedural states: Secondary | ICD-10-CM

## 2016-05-25 DIAGNOSIS — Z3A38 38 weeks gestation of pregnancy: Secondary | ICD-10-CM

## 2016-05-25 LAB — PREPARE RBC (CROSSMATCH)

## 2016-05-25 LAB — RPR: RPR: NONREACTIVE

## 2016-05-25 SURGERY — Surgical Case
Anesthesia: Spinal | Laterality: Bilateral

## 2016-05-25 MED ORDER — KETOROLAC TROMETHAMINE 30 MG/ML IJ SOLN
INTRAMUSCULAR | Status: AC
  Filled 2016-05-25: qty 1

## 2016-05-25 MED ORDER — MEPERIDINE HCL 25 MG/ML IJ SOLN
6.2500 mg | INTRAMUSCULAR | Status: DC | PRN
  Administered 2016-05-25: 6.25 mg via INTRAVENOUS

## 2016-05-25 MED ORDER — LIDOCAINE-EPINEPHRINE 2 %-1:100000 IJ SOLN
INTRAMUSCULAR | Status: DC | PRN
  Administered 2016-05-25: 2 mL via INTRADERMAL
  Administered 2016-05-25: 5 mL via INTRADERMAL
  Administered 2016-05-25: 3 mL via INTRADERMAL

## 2016-05-25 MED ORDER — ONDANSETRON HCL 4 MG/2ML IJ SOLN
INTRAMUSCULAR | Status: AC
  Filled 2016-05-25: qty 2

## 2016-05-25 MED ORDER — OXYTOCIN 10 UNIT/ML IJ SOLN
INTRAVENOUS | Status: DC | PRN
  Administered 2016-05-25: 40 [IU] via INTRAVENOUS

## 2016-05-25 MED ORDER — ZOLPIDEM TARTRATE 5 MG PO TABS: 5.0000 mg | ORAL_TABLET | Freq: Every evening | ORAL | Status: DC | PRN

## 2016-05-25 MED ORDER — DEXAMETHASONE SODIUM PHOSPHATE 4 MG/ML IJ SOLN
INTRAMUSCULAR | Status: AC
  Filled 2016-05-25: qty 1

## 2016-05-25 MED ORDER — NALBUPHINE HCL 10 MG/ML IJ SOLN: 5.0000 mg | Freq: Once | INTRAMUSCULAR | Status: DC | PRN

## 2016-05-25 MED ORDER — FENTANYL CITRATE (PF) 100 MCG/2ML IJ SOLN
INTRAMUSCULAR | Status: AC
  Filled 2016-05-25: qty 2

## 2016-05-25 MED ORDER — OXYCODONE HCL 5 MG PO TABS: 5.0000 mg | ORAL_TABLET | ORAL | Status: DC | PRN

## 2016-05-25 MED ORDER — MENTHOL 3 MG MT LOZG: 1.0000 | LOZENGE | OROMUCOSAL | Status: DC | PRN

## 2016-05-25 MED ORDER — ONDANSETRON HCL 4 MG/2ML IJ SOLN: 4.0000 mg | Freq: Three times a day (TID) | INTRAMUSCULAR | Status: DC | PRN

## 2016-05-25 MED ORDER — IBUPROFEN 600 MG PO TABS: 600.0000 mg | ORAL_TABLET | Freq: Four times a day (QID) | ORAL | Status: DC

## 2016-05-25 MED ORDER — ACETAMINOPHEN 325 MG PO TABS
650.0000 mg | ORAL_TABLET | ORAL | Status: DC | PRN
  Administered 2016-05-25 – 2016-05-28 (×6): 650 mg via ORAL
  Filled 2016-05-25 (×6): qty 2

## 2016-05-25 MED ORDER — OXYTOCIN 40 UNITS IN LACTATED RINGERS INFUSION - SIMPLE MED: 2.5000 [IU]/h | INTRAVENOUS | Status: AC

## 2016-05-25 MED ORDER — LIDOCAINE-EPINEPHRINE (PF) 2 %-1:200000 IJ SOLN
INTRAMUSCULAR | Status: AC
  Filled 2016-05-25: qty 20

## 2016-05-25 MED ORDER — MEPERIDINE HCL 25 MG/ML IJ SOLN
INTRAMUSCULAR | Status: AC
  Filled 2016-05-25: qty 1

## 2016-05-25 MED ORDER — LACTATED RINGERS IV SOLN
INTRAVENOUS | Status: DC
  Administered 2016-05-26: 09:00:00 via INTRAVENOUS

## 2016-05-25 MED ORDER — CEFAZOLIN SODIUM-DEXTROSE 2-4 GM/100ML-% IV SOLN
INTRAVENOUS | Status: AC
  Filled 2016-05-25: qty 100

## 2016-05-25 MED ORDER — LACTATED RINGERS IV SOLN
Freq: Once | INTRAVENOUS | Status: AC
  Administered 2016-05-25: 1000 mL/h via INTRAVENOUS

## 2016-05-25 MED ORDER — HYDROMORPHONE HCL 1 MG/ML IJ SOLN
0.5000 mg | INTRAMUSCULAR | Status: DC | PRN
  Administered 2016-05-25: 0.5 mg via INTRAVENOUS

## 2016-05-25 MED ORDER — BUPIVACAINE HCL (PF) 0.5 % IJ SOLN
INTRAMUSCULAR | Status: AC
  Filled 2016-05-25: qty 30

## 2016-05-25 MED ORDER — CEFAZOLIN SODIUM-DEXTROSE 2-3 GM-% IV SOLR
INTRAVENOUS | Status: DC | PRN
  Administered 2016-05-25: 2 g via INTRAVENOUS

## 2016-05-25 MED ORDER — NALBUPHINE HCL 10 MG/ML IJ SOLN: 5.0000 mg | INTRAMUSCULAR | Status: DC | PRN

## 2016-05-25 MED ORDER — DIPHENHYDRAMINE HCL 25 MG PO CAPS
25.0000 mg | ORAL_CAPSULE | ORAL | Status: DC | PRN
  Filled 2016-05-25: qty 1

## 2016-05-25 MED ORDER — PRENATAL MULTIVITAMIN CH
1.0000 | ORAL_TABLET | Freq: Every day | ORAL | Status: DC
  Administered 2016-05-26 – 2016-05-28 (×3): 1 via ORAL
  Filled 2016-05-25 (×2): qty 1

## 2016-05-25 MED ORDER — SODIUM CHLORIDE 0.9% FLUSH: 3.0000 mL | INTRAVENOUS | Status: DC | PRN

## 2016-05-25 MED ORDER — ONDANSETRON HCL 4 MG/2ML IJ SOLN
INTRAMUSCULAR | Status: DC | PRN
Start: 2016-05-25 — End: 2016-05-25
  Administered 2016-05-25: 4 mg via INTRAVENOUS

## 2016-05-25 MED ORDER — MORPHINE SULFATE-NACL 0.5-0.9 MG/ML-% IV SOSY
PREFILLED_SYRINGE | INTRAVENOUS | Status: AC
  Filled 2016-05-25: qty 1

## 2016-05-25 MED ORDER — WITCH HAZEL-GLYCERIN EX PADS: 1.0000 "application " | MEDICATED_PAD | CUTANEOUS | Status: DC | PRN

## 2016-05-25 MED ORDER — TETANUS-DIPHTH-ACELL PERTUSSIS 5-2.5-18.5 LF-MCG/0.5 IM SUSP: 0.5000 mL | Freq: Once | INTRAMUSCULAR | Status: DC

## 2016-05-25 MED ORDER — SCOPOLAMINE 1 MG/3DAYS TD PT72
MEDICATED_PATCH | TRANSDERMAL | Status: AC
  Filled 2016-05-25: qty 1

## 2016-05-25 MED ORDER — SODIUM BICARBONATE 8.4 % IV SOLN
INTRAVENOUS | Status: DC | PRN
  Administered 2016-05-25 (×2): 5 mL via EPIDURAL

## 2016-05-25 MED ORDER — SIMETHICONE 80 MG PO CHEW
80.0000 mg | CHEWABLE_TABLET | ORAL | Status: DC
  Administered 2016-05-27 (×2): 80 mg via ORAL
  Filled 2016-05-25 (×2): qty 1

## 2016-05-25 MED ORDER — PHENYLEPHRINE 8 MG IN D5W 100 ML (0.08MG/ML) PREMIX OPTIME
INJECTION | INTRAVENOUS | Status: AC
  Filled 2016-05-25: qty 100

## 2016-05-25 MED ORDER — HYDROMORPHONE HCL 1 MG/ML IJ SOLN
INTRAMUSCULAR | Status: AC
Start: 2016-05-25 — End: 2016-05-25
  Administered 2016-05-25: 0.5 mg via INTRAVENOUS
  Filled 2016-05-25: qty 1

## 2016-05-25 MED ORDER — SIMETHICONE 80 MG PO CHEW: 80.0000 mg | CHEWABLE_TABLET | ORAL | Status: DC | PRN

## 2016-05-25 MED ORDER — SENNOSIDES-DOCUSATE SODIUM 8.6-50 MG PO TABS
2.0000 | ORAL_TABLET | ORAL | Status: DC
  Administered 2016-05-27 (×2): 2 via ORAL
  Filled 2016-05-25 (×2): qty 2

## 2016-05-25 MED ORDER — LACTATED RINGERS IV SOLN
INTRAVENOUS | Status: DC
Start: 2016-05-25 — End: 2016-05-25
  Administered 2016-05-25: 17:00:00 via INTRAVENOUS
  Administered 2016-05-25: 125 mL/h via INTRAVENOUS

## 2016-05-25 MED ORDER — NALOXONE HCL 2 MG/2ML IJ SOSY
1.0000 ug/kg/h | PREFILLED_SYRINGE | INTRAVENOUS | Status: DC | PRN
  Filled 2016-05-25: qty 2

## 2016-05-25 MED ORDER — MORPHINE SULFATE (PF) 0.5 MG/ML IJ SOLN
INTRAMUSCULAR | Status: DC | PRN
  Administered 2016-05-25: .2 mg via INTRATHECAL

## 2016-05-25 MED ORDER — COCONUT OIL OIL: 1.0000 "application " | TOPICAL_OIL | Status: DC | PRN

## 2016-05-25 MED ORDER — KETOROLAC TROMETHAMINE 30 MG/ML IJ SOLN
30.0000 mg | Freq: Four times a day (QID) | INTRAMUSCULAR | Status: DC | PRN
  Administered 2016-05-25: 30 mg via INTRAMUSCULAR

## 2016-05-25 MED ORDER — IBUPROFEN 600 MG PO TABS
600.0000 mg | ORAL_TABLET | Freq: Four times a day (QID) | ORAL | Status: DC
  Administered 2016-05-26 – 2016-05-28 (×11): 600 mg via ORAL
  Filled 2016-05-25 (×9): qty 1

## 2016-05-25 MED ORDER — PHENYLEPHRINE 8 MG IN D5W 100 ML (0.08MG/ML) PREMIX OPTIME
INJECTION | INTRAVENOUS | Status: DC | PRN
  Administered 2016-05-25: 30 ug/min via INTRAVENOUS

## 2016-05-25 MED ORDER — NALOXONE HCL 0.4 MG/ML IJ SOLN: 0.4000 mg | INTRAMUSCULAR | Status: DC | PRN

## 2016-05-25 MED ORDER — DIBUCAINE 1 % RE OINT: 1.0000 "application " | TOPICAL_OINTMENT | RECTAL | Status: DC | PRN

## 2016-05-25 MED ORDER — DIPHENHYDRAMINE HCL 25 MG PO CAPS: 25.0000 mg | ORAL_CAPSULE | Freq: Four times a day (QID) | ORAL | Status: DC | PRN

## 2016-05-25 MED ORDER — KETOROLAC TROMETHAMINE 30 MG/ML IJ SOLN: 30.0000 mg | Freq: Four times a day (QID) | INTRAMUSCULAR | Status: DC | PRN

## 2016-05-25 MED ORDER — SIMETHICONE 80 MG PO CHEW
80.0000 mg | CHEWABLE_TABLET | Freq: Three times a day (TID) | ORAL | Status: DC
  Administered 2016-05-26 – 2016-05-28 (×7): 80 mg via ORAL
  Filled 2016-05-25 (×7): qty 1

## 2016-05-25 MED ORDER — DIPHENHYDRAMINE HCL 50 MG/ML IJ SOLN: 12.5000 mg | INTRAMUSCULAR | Status: DC | PRN

## 2016-05-25 MED ORDER — SCOPOLAMINE 1 MG/3DAYS TD PT72
1.0000 | MEDICATED_PATCH | Freq: Once | TRANSDERMAL | Status: DC
  Administered 2016-05-25: 1.5 mg via TRANSDERMAL

## 2016-05-25 MED ORDER — OXYTOCIN 10 UNIT/ML IJ SOLN
INTRAMUSCULAR | Status: AC
  Filled 2016-05-25: qty 4

## 2016-05-25 MED ORDER — BUPIVACAINE IN DEXTROSE 0.75-8.25 % IT SOLN
INTRATHECAL | Status: DC | PRN
  Administered 2016-05-25: 1.8 mL via INTRATHECAL

## 2016-05-25 MED ORDER — SODIUM BICARBONATE 8.4 % IV SOLN
INTRAVENOUS | Status: AC
  Filled 2016-05-25: qty 50

## 2016-05-25 MED ORDER — LACTATED RINGERS IV SOLN
INTRAVENOUS | Status: DC | PRN
  Administered 2016-05-25: 18:00:00 via INTRAVENOUS

## 2016-05-25 MED ORDER — FENTANYL CITRATE (PF) 100 MCG/2ML IJ SOLN
INTRAMUSCULAR | Status: DC | PRN
  Administered 2016-05-25: 10 ug via INTRATHECAL
  Administered 2016-05-25 (×2): 50 ug via INTRAVENOUS
  Administered 2016-05-25: 25 ug via INTRAVENOUS
  Administered 2016-05-25: 15 ug via INTRAVENOUS
  Administered 2016-05-25: 50 ug via INTRAVENOUS

## 2016-05-25 MED ORDER — KETAMINE HCL 10 MG/ML IJ SOLN
INTRAMUSCULAR | Status: AC
  Filled 2016-05-25: qty 1

## 2016-05-25 MED ORDER — SODIUM CHLORIDE 0.9 % IR SOLN
Status: DC | PRN
  Administered 2016-05-25: 1000 mL

## 2016-05-25 MED ORDER — BUPIVACAINE HCL (PF) 0.5 % IJ SOLN
INTRAMUSCULAR | Status: DC | PRN
  Administered 2016-05-25: 30 mL

## 2016-05-25 MED ORDER — OXYCODONE HCL 5 MG PO TABS
10.0000 mg | ORAL_TABLET | ORAL | Status: DC | PRN
  Administered 2016-05-26 – 2016-05-28 (×10): 10 mg via ORAL
  Filled 2016-05-25 (×11): qty 2

## 2016-05-25 MED ORDER — SCOPOLAMINE 1 MG/3DAYS TD PT72: 1.0000 | MEDICATED_PATCH | Freq: Once | TRANSDERMAL | Status: DC

## 2016-05-25 SURGICAL SUPPLY — 42 items
CANISTER SUCT 3000ML PPV (MISCELLANEOUS) ×3 IMPLANT
CHLORAPREP W/TINT 26ML (MISCELLANEOUS) ×3 IMPLANT
CLIP FILSHIE TUBAL LIGA STRL (Clip) ×2 IMPLANT
CLOSURE WOUND 1/2 X4 (GAUZE/BANDAGES/DRESSINGS)
DRSG OPSITE POSTOP 4X10 (GAUZE/BANDAGES/DRESSINGS) ×3 IMPLANT
DRSG PAD ABDOMINAL 8X10 ST (GAUZE/BANDAGES/DRESSINGS) ×2 IMPLANT
DRSG TELFA 3X8 NADH (GAUZE/BANDAGES/DRESSINGS) IMPLANT
ELECT CAUTERY BLADE 6.4 (BLADE) ×3 IMPLANT
ELECT REM PT RETURN 9FT ADLT (ELECTROSURGICAL) ×3
ELECTRODE REM PT RTRN 9FT ADLT (ELECTROSURGICAL) ×1 IMPLANT
GLOVE BIOGEL PI IND STRL 7.0 (GLOVE) ×2 IMPLANT
GLOVE BIOGEL PI INDICATOR 7.0 (GLOVE) ×4
GLOVE INDICATOR 7.5 STRL GRN (GLOVE) ×3 IMPLANT
GOWN STRL REUS W/ TWL LRG LVL3 (GOWN DISPOSABLE) ×2 IMPLANT
GOWN STRL REUS W/ TWL XL LVL3 (GOWN DISPOSABLE) ×1 IMPLANT
GOWN STRL REUS W/TWL LRG LVL3 (GOWN DISPOSABLE) ×6
GOWN STRL REUS W/TWL XL LVL3 (GOWN DISPOSABLE) ×3
LIQUID BAND (GAUZE/BANDAGES/DRESSINGS) ×1 IMPLANT
NS IRRIG 1000ML POUR BTL (IV SOLUTION) ×3 IMPLANT
PACK C SECTION WH (CUSTOM PROCEDURE TRAY) ×3 IMPLANT
PAD DRESSING TELFA 3X8 NADH (GAUZE/BANDAGES/DRESSINGS) ×1 IMPLANT
PAD OB MATERNITY 4.3X12.25 (PERSONAL CARE ITEMS) ×3 IMPLANT
PAD PREP 24X48 CUFFED NSTRL (MISCELLANEOUS) ×3 IMPLANT
RETRACTOR WND ALEXIS 25 LRG (MISCELLANEOUS) IMPLANT
RTRCTR WOUND ALEXIS 25CM LRG (MISCELLANEOUS) ×3
SPONGE LAP 18X18 X RAY DECT (DISPOSABLE) ×4 IMPLANT
STAPLER VISISTAT 35W (STAPLE) ×2 IMPLANT
STRIP CLOSURE SKIN 1/2X4 (GAUZE/BANDAGES/DRESSINGS) ×1 IMPLANT
SUT MON AB 4-0 PS1 27 (SUTURE) ×3 IMPLANT
SUT MON AB-0 CT1 36 (SUTURE) ×6 IMPLANT
SUT PDS AB 0 CTX 36 PDP370T (SUTURE) ×2 IMPLANT
SUT PDS AB 0 CTX 60 (SUTURE) ×2 IMPLANT
SUT PLAIN 0 NONE (SUTURE) ×3 IMPLANT
SUT PLAIN 2 0 (SUTURE) ×3
SUT PLAIN ABS 2-0 CT1 27XMFL (SUTURE) ×1 IMPLANT
SUT VIC AB 0 CT1 36 (SUTURE) ×6 IMPLANT
SUT VIC AB 2-0 CT1 27 (SUTURE) ×9
SUT VIC AB 2-0 CT1 TAPERPNT 27 (SUTURE) IMPLANT
SUT VIC AB 3-0 CT1 27 (SUTURE) ×3
SUT VIC AB 3-0 CT1 TAPERPNT 27 (SUTURE) ×1 IMPLANT
SYRINGE 10CC LL (SYRINGE) ×3 IMPLANT
TOWEL OR 17X24 6PK STRL BLUE (TOWEL DISPOSABLE) ×6 IMPLANT

## 2016-05-25 NOTE — Anesthesia Postprocedure Evaluation (Signed)
Anesthesia Post Note  Patient: Kristine Beard  Procedure(s) Performed: Procedure(s) (LRB): REPEAT CESAREAN SECTION WITH BILATERAL TUBAL LIGATION (Bilateral)  Patient location during evaluation: PACU Anesthesia Type: Spinal and MAC Level of consciousness: awake and alert Pain management: pain level controlled Vital Signs Assessment: post-procedure vital signs reviewed and stable Respiratory status: spontaneous breathing and respiratory function stable Cardiovascular status: blood pressure returned to baseline and stable Postop Assessment: spinal receding Anesthetic complications: no     Last Vitals:  Vitals:   05/25/16 1915 05/25/16 1930  BP: 140/80 (!) 151/79  Pulse:  60  Resp:  20  Temp:  36.3 C    Last Pain:  Vitals:   05/25/16 1930  TempSrc: Oral  PainSc:    Pain Goal: Patients Stated Pain Goal: 4 (05/25/16 1118)               Natassja Ollis DANIEL

## 2016-05-25 NOTE — Op Note (Addendum)
Cesarean Section Operative Report  Kristine Beard  05/25/2016  Indications: Scheduled surgery  Pre-operative Diagnosis: Pregnancy at 38/2 week. prior cesarean sections and history of myomectomy (treated as a classical incision and desire for permanent sterilization  Post-operative Diagnosis: Same   Procedure:  Repeat primary low transverse with bilalateral tubal ligation via Filshie clips  Surgeon: Surgeon(s) and Role:    * Aletha Halim, MD - Primary        Kristine Doe MD  Assistants: none  Anesthesia:spinal converted to epidural  Specimens: none  Antibiotics: Ancef 2gm given within one hour of skin incision  VTE Prophylaxis: SCDs to bilateral lower extremities  Findings: Viable girl infant in cephalic presentation; Apgars 9 and 9; clear amniotic fluid; intact placenta with three vessel cord; normal uterus, fallopian tubes and ovaries bilaterally. Suprisingly little to no adhesions from skin to the uterus.   Baby condition / location:  Couplet care / Skin to Skin   Complications: no complications  Procedure Details:  The patient was taken back to the operative suite where spinal was placed but ineffective so patient was sat back up up and had an epidural anesthesia was placed.  A time out was held and the above information confirmed. The patient was draped and prepped in the usual sterile manner and placed in a dorsal supine position with a leftward tilt. A Pfannenstiel incision was made above her current keloid and carried down through the subcutaneous tissue to the fascia. Fascial incision was made and sharply extended transversely. The fascia was separated from the underlying rectus tissue superiorly and inferiorly. The peritoneum was identified and sharply entered and extended longitudinally. Cautery was used to incision the rectus muscles. An Alexis retractor was placed. A low transverse uterine incision was made and extended bluntly. Delivered from cephalic  presentation was a viable infant with Apgars as above.  After waiting 60 seconds for delayed cord cutting, the umbilical cord was clamped and cut cord blood was obtained for evaluation. Cord ph was not sent. The uterus was exteriorized. The placenta was removed Intact and appeared normal. The uterine outline, tubes and ovaries appeared normal. The uterine incision was closed with running locked sutures of 85moncryl with an imbricating layer of the same.   Hemostasis was observed. The attention was then turned to the right ovary and tube. A flishe clip was placed completed clamping of the tubes bilaterally after tracing them to their respective fimbriae; the [name] was then removed. The rectus muscles were examined and hemostasis observed. The fascia was then reapproximated with running sutures of Loop PDS. While tying the suture after 4 throws the suture broke leaving two tails, with the short one about 4cm in length. The long one had another PDS tied to it from the lateral fascia to help anchor the main PDS suture.   The subcuticular closure was performed using 3-0plain gut. The skin was closed with staples.  Instrument, sponge, and needle counts were correct prior the abdominal closure and were correct at the conclusion of the case.     Disposition: PACU - hemodynamically stable.       Signed: Brayton Mars 05/25/2016 7:04 PM    I was present and scrubbed for the entire procedure.  Durene Romans MD Attending Center for [employer] [facility] [employer])

## 2016-05-25 NOTE — H&P (Signed)
Obstetrics Admission History & Physical  05/25/2016 - 12:35 PM Primary OBGYN: [place]  Chief Complaint: pre op H&P  History of Present Illness  40 y.o. [id number] @ [redacted]w[redacted]d, with the above CC. Pregnancy complicated by: prior myomectomy (classical incision), h/o c-section x 2, anemia, bmi 31, gbs pos.   Ms. Bralynn Mukherjee Dosu states that she has good FM and no s/s of labor.   Review of Systems: her 12 point review of systems is negative or as noted in the History of Present Illness.  PMHx:  Past Medical History:  Diagnosis Date  . Endometriosis   . Fibroid, uterine    PSHx:  Past Surgical History:  Procedure Laterality Date  . CESAREAN SECTION    . CESAREAN SECTION N/A 05/25/2013   Procedure: CESAREAN SECTION;  Surgeon: Frederico Hamman, MD;  Location: [facility];  Service: Obstetrics;  Laterality: N/A;  . MYOMECTOMY     Medications: PNV, tums, tylenol PRN.   Allergies: has No Known Allergies. OBHx:  OB History  Gravida Para Term Preterm AB Living  3 2 2     2   SAB TAB Ectopic Multiple Live Births          2    # Outcome Date GA Lbr Len/2nd Weight Sex Delivery Anes PTL Lv  3 Current           2 Term 05/25/13 [redacted]w[redacted]d  2.885 kg (6 lb 5.8 oz) M CS-LTranv Spinal  LIV  1 Term     M CS-LTranv   LIV             FHx: History reviewed. No pertinent family history. Soc Hx:  Social History   Social History  . Marital status: Married    Spouse name: N/A  . Number of children: N/A  . Years of education: N/A   Occupational History  . Not on file.   Social History Main Topics  . Smoking status: Never Smoker  . Smokeless tobacco: Never Used  . Alcohol use No  . Drug use: No  . Sexual activity: Yes    Birth control/ protection: None   Other Topics Concern  . Not on file   Social History Narrative  . No narrative on file    Objective    Current Vital Signs 24h Vital Sign Ranges  T 98.4 F (36.9 C) Temp  Avg: 98.4 F (36.9 C)  Min: 98.4 F (36.9 C)  Max: 98.4 F (36.9 C)   BP 135/74 BP  Min: 135/74  Max: 135/74  HR 81 Pulse  Avg: 81  Min: 81  Max: 81  RR 16 Resp  Avg: 16  Min: 16  Max: 16  SaO2 100 % Not Delivered SpO2  Avg: 100 %  Min: 100 %  Max: 100 %       24 Hour I/O Current Shift I/O  Time Ins Outs No intake/output data recorded. No intake/output data recorded.   General: Well nourished, well developed female in no acute distress.  Skin:  Warm and dry.  Cardiovascular: S1, S2 normal, no murmur, rub or gallop, regular rate and rhythm Respiratory:  Clear to auscultation bilateral. Normal respiratory effort Abdomen: gravid, vertical midline and LT incision with keloid x 2 Neuro/Psych:  Normal mood and affect.    Labs  O POS   Recent Labs Lab 05/24/16 1040  WBC 5.6  HGB 8.9*  HCT 26.7*  PLT 204    Radiology Anterior placenta  Perinatal info   ABO, Rh: --/--/  O POS (08/28 1040) Antibody: NEG (08/28 1040) Rubella: 15.60 (02/13 1424) RPR: Non Reactive (08/28 1040)  HBsAg: NEGATIVE (02/13 1424)  HIV: NONREACTIVE (06/28 1141)  [id number] (08/14 0000)   Assessment & Plan   40 y.o. [id number] @ [redacted]w[redacted]d ready for repeat c-section Pt ready for c-section. D/w her re: BTL and she still desires this and papers are UTD. I d/w likely chance that she will get the two units of PRBCs that are crossmatched for her given her baseline anemia, c-section and possible risk of scar tissue which increases bleeding risk and possibility of injury to surrounding structures and possibility of not being able to do BTL due to scar tissue. NPO since before MN and will proceed after urgent cases in the OR are done, which patient is okay with  Durene Romans MD Attending Center for [facility])

## 2016-05-25 NOTE — Anesthesia Preprocedure Evaluation (Signed)
Anesthesia Evaluation  Patient identified by MRN, date of birth, ID band Patient awake    Reviewed: Allergy & Precautions, NPO status , Patient's Chart, lab work & pertinent test results  Airway Mallampati: II  TM Distance: >3 FB Neck ROM: Full    Dental   Pulmonary neg pulmonary ROS,    Pulmonary exam normal        Cardiovascular negative cardio ROS Normal cardiovascular exam     Neuro/Psych negative neurological ROS     GI/Hepatic negative GI ROS, Neg liver ROS,   Endo/Other  negative endocrine ROS  Renal/GU negative Renal ROS     Musculoskeletal   Abdominal   Peds  Hematology  (+) anemia ,   Anesthesia Other Findings   Reproductive/Obstetrics (+) Pregnancy                             Lab Results  Component Value Date   WBC 5.6 05/24/2016   HGB 8.9 (L) 05/24/2016   HCT 26.7 (L) 05/24/2016   MCV 87.3 05/24/2016   PLT 204 05/24/2016   No results found for: CREATININE, BUN, NA, K, CL, CO2  Anesthesia Physical Anesthesia Plan  ASA: III  Anesthesia Plan: Spinal   Post-op Pain Management:    Induction:   Airway Management Planned: Natural Airway  Additional Equipment:   Intra-op Plan:   Post-operative Plan:   Informed Consent: I have reviewed the patients History and Physical, chart, labs and discussed the procedure including the risks, benefits and alternatives for the proposed anesthesia with the patient or authorized representative who has indicated his/her understanding and acceptance.     Plan Discussed with: CRNA  Anesthesia Plan Comments:         Anesthesia Quick Evaluation

## 2016-05-25 NOTE — Anesthesia Procedure Notes (Signed)
Epidural Patient location during procedure: OB  Staffing Anesthesiologist: Suzette Battiest Performed: anesthesiologist   Preanesthetic Checklist Completed: patient identified, site marked, surgical consent, pre-op evaluation, timeout performed, IV checked, risks and benefits discussed and monitors and equipment checked  Epidural Patient position: sitting Prep: site prepped and draped and DuraPrep Patient monitoring: continuous pulse ox and blood pressure Approach: midline Location: L4-L5 Injection technique: LOR air  Needle:  Needle type: Tuohy  Needle gauge: 17 G Needle length: 9 cm and 9 Needle insertion depth: 5 cm and 5.5 cm Catheter type: closed end flexible Catheter size: 19 Gauge Catheter at skin depth: 11 (10cm initially at skin. Advanced to 11cm when laid in lat decub.) cm Test dose: negative  Assessment Sensory level: T6 Events: blood not aspirated, injection not painful, no injection resistance, negative IV test and no paresthesia

## 2016-05-25 NOTE — Anesthesia Procedure Notes (Signed)
Spinal  Staffing Anesthesiologist: Suzette Battiest Performed: anesthesiologist  Preanesthetic Checklist Completed: patient identified, site marked, surgical consent, pre-op evaluation, timeout performed, IV checked, risks and benefits discussed and monitors and equipment checked Spinal Block Patient position: sitting Prep: site prepped and draped and DuraPrep Patient monitoring: continuous pulse ox, blood pressure and heart rate Approach: midline Location: L4-5 Injection technique: single-shot Needle Needle type: Sprotte  Needle gauge: 24 G Needle length: 12.7 cm Assessment Events: failed spinal Additional Notes CSF aspiration before and after injection LA. Pt with no block after 15 min to alice test. Legs freely mobile. Sat up for epidural.

## 2016-05-25 NOTE — Transfer of Care (Signed)
Immediate Anesthesia Transfer of Care Note  Patient: Kristine Beard  Procedure(s) Performed: Procedure(s): REPEAT CESAREAN SECTION WITH BILATERAL TUBAL LIGATION (Bilateral)  Patient Location: PACU  Anesthesia Type:Epidural  Level of Consciousness: sedated  Airway & Oxygen Therapy: Patient Spontanous Breathing  Post-op Assessment: Report given to RN  Post vital signs: Reviewed and stable  Last Vitals:  Vitals:   05/25/16 1118  BP: 135/74  Pulse: 81  Resp: 16  Temp: 36.9 C    Last Pain:  Vitals:   05/25/16 1118  TempSrc: Oral      Patients Stated Pain Goal: 4 ([id number])  Complications: No apparent anesthesia complications

## 2016-05-26 ENCOUNTER — Encounter (HOSPITAL_COMMUNITY): Payer: Self-pay | Admitting: Obstetrics and Gynecology

## 2016-05-26 DIAGNOSIS — Z9889 Other specified postprocedural states: Secondary | ICD-10-CM

## 2016-05-26 LAB — CBC
HCT: 22.8 % — ABNORMAL LOW (ref 36.0–46.0)
Hemoglobin: 7.7 g/dL — ABNORMAL LOW (ref 12.0–15.0)
MCH: 29.1 pg (ref 26.0–34.0)
MCHC: 33.8 g/dL (ref 30.0–36.0)
MCV: 86 fL (ref 78.0–100.0)
PLATELETS: 190 10*3/uL (ref 150–400)
RBC: 2.65 MIL/uL — ABNORMAL LOW (ref 3.87–5.11)
RDW: 13.9 % (ref 11.5–15.5)
WBC: 8.7 10*3/uL (ref 4.0–10.5)

## 2016-05-26 MED ORDER — SODIUM CHLORIDE 0.9 % IV SOLN
510.0000 mg | Freq: Once | INTRAVENOUS | Status: AC
  Administered 2016-05-26: 510 mg via INTRAVENOUS
  Filled 2016-05-26: qty 17

## 2016-05-26 NOTE — Progress Notes (Signed)
Bed bath given, patient somewhat independent. Ambulated to the chair with minimal to no assistance, catheter emptied for 400 of clear yellow urine. Tolerated well.

## 2016-05-26 NOTE — Progress Notes (Signed)
UR chart review completed.  

## 2016-05-26 NOTE — Progress Notes (Signed)
Subjective: Postoperative Day 1: Cesarean Delivery Patient reports mild well-controlled incisional pain and tolerating PO.  Foley in and has not yet had BM or passed flatus.  Objective: Vital signs in last 24 hours: Temp:  [97.4 F (36.3 C)-98.6 F (37 C)] 98.2 F (36.8 C) (08/30 0315) Pulse Rate:  [55-81] 55 (08/30 0315) Resp:  [7-31] 18 (08/30 0315) BP: (121-151)/(56-81) 130/72 (08/30 0315) SpO2:  [98 %-100 %] 100 % (08/30 0630)  Physical Exam:  General: alert, cooperative and no distress Lochia: appropriate Uterine Fundus: firm Incision: dressing c/d/i DVT Evaluation: No evidence of DVT seen on physical exam. Negative Homan's sign.   Recent Labs  05/24/16 1040 05/26/16 0500  HGB 8.9* 7.7*  HCT 26.7* 22.8*    Assessment/Plan: Status post Cesarean section. Doing well postoperatively.  Continue current care. Plan for staple removal on POD#3  Kristine Beard PGY-1 05/26/2016, 7:44 AM  OB FELLOW POSTPARTUM PROGRESS NOTE ATTESTATION  I have seen and examined this patient and agree with above documentation in the resident's note.   Kristine Doe, MD 7:47 AM

## 2016-05-26 NOTE — Lactation Note (Signed)
This note was copied from a baby's chart. Lactation Consultation Note  Patient Name: Girl Kadeesha Reimer Today's Date: 05/26/2016 Reason for consult: Initial assessment Assisted Mom with positioning and obtaining good depth with latch. Mom is supplementing by choice according to guidelines per hours of age. Finger feeding using curved tipped syringe and happy with this method. LC advised if Mom continues to supplement we will need to discuss other methods but this was fine for now with small amounts. Encouraged Mom not to supplement unless medically necessary. Supply/demand reviewed and discussed risk of early supplementation to BF success. Advised baby should be at breast 8-12 times in 24 hours and with feeding ques. Cluster feeding discussed. Advised if she continues to supplement to be sure she BF both breasts before giving any supplement. Lactation brochure left for review, advised of OP services and support group. Encouraged to call for assist as needed.   Maternal Data Has patient been taught Hand Expression?: Yes  Feeding Feeding Type: Breast Fed  LATCH Score/Interventions Latch: Repeated attempts needed to sustain latch, nipple held in mouth throughout feeding, stimulation needed to elicit sucking reflex.  Audible Swallowing: A few with stimulation  Type of Nipple: Everted at rest and after stimulation  Comfort (Breast/Nipple): Soft / non-tender     Hold (Positioning): Assistance needed to correctly position infant at breast and maintain latch. Intervention(s): Breastfeeding basics reviewed;Support Pillows;Position options;Skin to skin  LATCH Score: 7  Lactation Tools Discussed/Used WIC Program: Yes   Consult Status Consult Status: Follow-up Date: 05/27/16 Follow-up type: In-patient    Katrine Coho 05/26/2016, 2:18 PM

## 2016-05-27 LAB — TYPE AND SCREEN
ABO/RH(D): O POS
ANTIBODY SCREEN: NEGATIVE
UNIT DIVISION: 0
UNIT DIVISION: 0
Unit division: 0
Unit division: 0

## 2016-05-27 NOTE — Progress Notes (Signed)
Post Operative Day 2 Subjective:  Kristine Beard is a 40 y.o. G3P3003 [redacted]w[redacted]d s/p rLTCS and BTL.  No acute events overnight.  Pt denies problems with ambulating, voiding or po intake.  She denies nausea or vomiting.  Pain is well controlled.  She has had flatus. She has not had bowel movement.  Lochia Small.  Plan for birth control is bilateral tubal ligation.  Method of Feeding: Breast and Bottle  Objective: Blood pressure 125/71, pulse 66, temperature 98.4 F (36.9 C), resp. rate 18, last menstrual period 08/31/2015, SpO2 100 %, unknown if currently breastfeeding.  Physical Exam:  General: alert, cooperative and no distress Lochia:normal flow Chest: CTAB Heart: RRR no m/r/g Abdomen: +BS, soft, nontender,  Uterine Fundus: firm Incision: dressing c/d/i DVT Evaluation: No evidence of DVT seen on physical exam. Negative Homan's sign.   Recent Labs  05/24/16 1040 05/26/16 0500  HGB 8.9* 7.7*  HCT 26.7* 22.8*    Assessment/Plan:  ASSESSMENT: Kristine Beard is a 40 y.o. G3P3003 [redacted]w[redacted]d s/p rLTCS and BTL/   Plan for discharge tomorrow   LOS: 2 days   Melina Schools 05/27/2016, 7:47 AM   OB FELLOW POSTPARTUM PROGRESS NOTE ATTESTATION  I have seen and examined this patient and agree with above documentation in the resident's note.   Katherine Basset, DO 11:30 PM

## 2016-05-28 LAB — CBC
HCT: 24.2 % — ABNORMAL LOW (ref 36.0–46.0)
Hemoglobin: 7.9 g/dL — ABNORMAL LOW (ref 12.0–15.0)
MCH: 28.6 pg (ref 26.0–34.0)
MCHC: 32.6 g/dL (ref 30.0–36.0)
MCV: 87.7 fL (ref 78.0–100.0)
PLATELETS: 205 10*3/uL (ref 150–400)
RBC: 2.76 MIL/uL — AB (ref 3.87–5.11)
RDW: 14.2 % (ref 11.5–15.5)
WBC: 6.3 10*3/uL (ref 4.0–10.5)

## 2016-05-28 MED ORDER — IBUPROFEN 600 MG PO TABS: 600.0000 mg | ORAL_TABLET | Freq: Four times a day (QID) | ORAL | 0 refills | Status: AC

## 2016-05-28 MED ORDER — DOCUSATE SODIUM 100 MG PO CAPS
100.0000 mg | ORAL_CAPSULE | Freq: Two times a day (BID) | ORAL | 0 refills | Status: AC
Start: 2016-05-28 — End: ?

## 2016-05-28 MED ORDER — OXYCODONE-ACETAMINOPHEN 5-325 MG PO TABS: 1.0000 | ORAL_TABLET | Freq: Four times a day (QID) | ORAL | 0 refills | Status: AC | PRN

## 2016-05-28 NOTE — Discharge Summary (Signed)
OB Discharge Summary     Patient Name: Kristine Beard DOB: 1976-07-05 MRN: [id number]  Date of admission: 05/25/2016 Delivering MD: Aletha Halim   Date of discharge: 05/28/2016  Admitting diagnosis: PREV CS,HISTORY OF MYOMECTOMY;DESIRES STERILIZATION Intrauterine pregnancy: [redacted]w[redacted]d     Secondary diagnosis:  Active Problems:   S/P cesarean section   Status post surgery  Additional problems: none     Discharge diagnosis: Term Pregnancy Delivered                                                                                                Post partum procedures:none  Augmentation: none  Complications: None  Hospital course:  Sceduled C/S   40 y.o. yo G3P3003 at [redacted]w[redacted]d was admitted to the hospital 05/25/2016 for scheduled cesarean section with the following indication:Elective Repeat.  Membrane Rupture Time/Date: 5:27 PM ,05/25/2016   Patient delivered a Viable infant.05/25/2016  Details of operation can be found in separate operative note.  Pateint had an uncomplicated postpartum course.  She is ambulating, tolerating a regular diet, passing flatus, and urinating well. Patient is discharged home in stable condition on  05/28/16          Physical exam Vitals:   05/26/16 0630 05/27/16 0500 05/27/16 1747 05/28/16 0529  BP:  125/71 136/72 (!) 146/65  Pulse:  66 66 70  Resp:  18 18 20   Temp:  98.4 F (36.9 C) 98.6 F (37 C) 98.2 F (36.8 C)  TempSrc:   Oral   SpO2: 100%      General: alert, cooperative and no distress Lochia: appropriate Uterine Fundus: firm Incision: Healing well with no significant drainage. Staples removed DVT Evaluation: No evidence of DVT seen on physical exam. No significant calf/ankle edema. Labs: Lab Results  Component Value Date   WBC 6.3 05/28/2016   HGB 7.9 (L) 05/28/2016   HCT 24.2 (L) 05/28/2016   MCV 87.7 05/28/2016   PLT 205 05/28/2016   No flowsheet data found.  Discharge instruction: per After Visit Summary and "Baby and Me  Booklet".  After visit meds:    Medication List    STOP taking these medications   promethazine 25 MG tablet Commonly known as:  PHENERGAN     TAKE these medications   acetaminophen 500 MG chewable tablet Commonly known as:  TYLENOL Chew 500 mg by mouth every 6 (six) hours as needed for pain.   calcium carbonate 500 MG chewable tablet Commonly known as:  TUMS - dosed in mg elemental calcium Chew 2 tablets by mouth daily as needed for indigestion or heartburn.   docusate sodium 100 MG capsule Commonly known as:  COLACE Take 1 capsule (100 mg total) by mouth 2 (two) times daily.   ibuprofen 600 MG tablet Commonly known as:  ADVIL,MOTRIN Take 1 tablet (600 mg total) by mouth every 6 (six) hours.   oxyCODONE-acetaminophen 5-325 MG tablet Commonly known as:  ROXICET Take 1 tablet by mouth every 6 (six) hours as needed for severe pain.   prenatal multivitamin Tabs tablet Take 1 tablet by mouth daily at 12 noon.  Diet: routine diet  Activity: Advance as tolerated. Pelvic rest for 6 weeks.   Outpatient follow up:6 weeks Follow up Appt:Future Appointments Date Time Provider [place]  06/08/2016 10:30 AM Osborne Oman, MD [facility]   Follow up Visit:No Follow-up on file.  Postpartum contraception: Tubal Ligation  Newborn Data: Live born female  Birth Weight: 6 lb 13 oz (3090 g) APGAR: 9, 9  Baby Feeding: Bottle and Breast Disposition:home with mother   05/28/2016 Jacquiline Doe, MD

## 2016-05-28 NOTE — Progress Notes (Signed)
Subjective: Postpartum Day 3: Cesarean Delivery  Kristine Beard is a 40 y.o. G3P3003 [redacted]w[redacted]d s/p rLTCS and BTL.  No acute events overnight.  Pt denies problems with ambulating, voiding or po intake.  She denies nausea or vomiting.  Pain is well controlled with medication.  She has had flatus. She has not had bowel movement.  Lochia Small.  Plan for birth control is bilateral tubal ligation.  Method of Feeding: Breast and Bottle  Objective: Vital signs in last 24 hours: Temp:  [98.2 F (36.8 C)-98.6 F (37 C)] 98.2 F (36.8 C) (09/01 0529) Pulse Rate:  [66-70] 70 (09/01 0529) Resp:  [18-20] 20 (09/01 0529) BP: (136-146)/(65-72) 146/65 (09/01 0529)  Physical Exam:  General: alert, cooperative and no distress Lochia: appropriate Uterine Fundus: firm Incision: healing well   Recent Labs  05/26/16 0500  HGB 7.7*  HCT 22.8*    Assessment/Plan: Status post Cesarean section. Doing well postoperatively.  Discharge home with standard precautions and return to clinic in 4-6 weeks.  Demetrios Isaacs 05/28/2016, 7:18 AM

## 2016-05-28 NOTE — Discharge Instructions (Signed)
Cesarean Delivery, Care After  Refer to this sheet in the next few weeks. These instructions provide you with information on caring for yourself after your procedure. Your health care provider may also give you specific instructions. Your treatment has been planned according to current medical practices, but problems sometimes occur. Call your health care provider if you have any problems or questions after you go home.  HOME CARE INSTRUCTIONS   Only take over-the-counter or prescription medications as directed by your health care provider.   Do not drink alcohol, especially if you are breastfeeding or taking medication to relieve pain.   Do not chew or smoke tobacco.   Continue to use good perineal care. Good perineal care includes:    Wiping your perineum from front to back.    Keeping your perineum clean.   Check your surgical cut (incision) daily for increased redness, drainage, swelling, or separation of skin.   Clean your incision gently with soap and water every day, and then pat it dry. If your health care provider says it is okay, leave the incision uncovered. Use a bandage (dressing) if the incision is draining fluid or appears irritated. If the adhesive strips across the incision do not fall off within 7 days, carefully peel them off.   Hug a pillow when coughing or sneezing until your incision is healed. This helps to relieve pain.   Do not use tampons or douche until your health care provider says it is okay.   Shower, wash your hair, and take tub baths as directed by your health care provider.   Wear a well-fitting bra that provides breast support.   Limit wearing support panties or control-top hose.   Drink enough fluids to keep your urine clear or pale yellow.   Eat high-fiber foods such as whole grain cereals and breads, brown rice, beans, and fresh fruits and vegetables every day. These foods may help prevent or relieve constipation.   Resume activities such as climbing stairs,  driving, lifting, exercising, or traveling as directed by your health care provider.   Talk to your health care provider about resuming sexual activities. This is dependent upon your risk of infection, your rate of healing, and your comfort and desire to resume sexual activity.   Try to have someone help you with your household activities and your newborn for at least a few days after you leave the hospital.   Rest as much as possible. Try to rest or take a nap when your newborn is sleeping.   Increase your activities gradually.   Keep all of your scheduled postpartum appointments. It is very important to keep your scheduled follow-up appointments. At these appointments, your health care provider will be checking to make sure that you are healing physically and emotionally.  SEEK MEDICAL CARE IF:    You are passing large clots from your vagina. Save any clots to show your health care provider.   You have a foul smelling discharge from your vagina.   You have trouble urinating.   You are urinating frequently.   You have pain when you urinate.   You have a change in your bowel movements.   You have increasing redness, pain, or swelling near your incision.   You have pus draining from your incision.   Your incision is separating.   You have painful, hard, or reddened breasts.   You have a severe headache.   You have blurred vision or see spots.   You feel sad   or depressed.   You have thoughts of hurting yourself or your newborn.   You have questions about your care, the care of your newborn, or medications.   You are dizzy or light-headed.   You have a rash.   You have pain, redness, or swelling at the site of the removed intravenous access (IV) tube.   You have nausea or vomiting.   You stopped breastfeeding and have not had a menstrual period within 12 weeks of stopping.   You are not breastfeeding and have not had a menstrual period within 12 weeks of delivery.   You have a fever.  SEEK  IMMEDIATE MEDICAL CARE IF:   You have persistent pain.   You have chest pain.   You have shortness of breath.   You faint.   You have leg pain.   You have stomach pain.   Your vaginal bleeding saturates 2 or more sanitary pads in 1 hour.  MAKE SURE YOU:    Understand these instructions.   Will watch your condition.   Will get help right away if you are not doing well or get worse.     This information is not intended to replace advice given to you by your health care provider. Make sure you discuss any questions you have with your health care provider.     Document Released: 06/05/2002 Document Revised: 10/04/2014 Document Reviewed: 05/10/2012  Elsevier Interactive Patient Education 2016 [employer]

## 2016-05-28 NOTE — Lactation Note (Signed)
This note was copied from a baby's chart. Lactation Consultation Note: experienced BF Mom reports baby just finished feeding for 30 min. Reports baby has been latching well but she feels she is still hungry after nursing. Has supplemented with a bottle of formula a few times during the night. Has not supplemented after this feeding. LS 9 by RN.No questions at present. To call prn  Patient Name: Kristine Beard [id number] Date: 05/28/2016 Reason for consult: Follow-up assessment   Maternal Data Formula Feeding for Exclusion: Yes Reason for exclusion: Mother's choice to formula and breast feed on admission Has patient been taught Hand Expression?: Yes Does the patient have breastfeeding experience prior to this delivery?: Yes  Feeding Feeding Type: Breast Fed Length of feed: 10 min  LATCH Score/Interventions                      Lactation Tools Discussed/Used     Consult Status Consult Status: Complete    Truddie Crumble 05/28/2016, 11:31 AM

## 2016-06-08 ENCOUNTER — Encounter: Payer: Self-pay | Admitting: Obstetrics & Gynecology

## 2016-06-22 ENCOUNTER — Encounter: Payer: Self-pay | Admitting: Obstetrics & Gynecology

## 2016-06-22 ENCOUNTER — Ambulatory Visit (INDEPENDENT_AMBULATORY_CARE_PROVIDER_SITE_OTHER): Payer: Self-pay | Admitting: Obstetrics & Gynecology

## 2016-06-22 NOTE — Progress Notes (Signed)
Post Partum Exam  Kristine Beard is a 40 y.o.  AA [id number] female who presents for a postpartum visit. She is 4 weeks postpartum following a low cervical transverse Cesarean section. I have fully reviewed the prenatal and intrapartum course. The delivery was at 58 gestational weeks.  Anesthesia: spinal. Postpartum course has been none. Baby's course has been unevetful Baby is feeding by both breast and bottle - na. Bleeding thin lochia. Bowel function is normal. Bladder function is normal. Patient is not sexually active. Contraception method is tubal ligation. Postpartum depression screening:neg  The following portions of the patient's history were reviewed and updated as appropriate: allergies, current medications, past family history, past medical history, past social history, past surgical history and problem list.  Review of Systems Pertinent items are noted in HPI.   Objective:    BP 116/78 mmHg  Pulse 78  Resp 16  Ht 5\' 5"  (1.651 m)  Wt 211 lb (95.709 kg)  BMI 35.11 kg/m2  Breastfeeding? Yes  General:  alert   Breasts:  inspection negative, no nipple discharge or bleeding, no masses or nodularity palpable  Lungs: clear to auscultation bilaterally  Heart:  regular rate and rhythm, S1, S2 normal, no murmur, click, rub or gallop  Abdomen: soft, non-tender; bowel sounds normal; no masses,  no organomegaly   Vulva:  normal  Vagina: normal vagina  Cervix:  not evaluated  Corpus: not examined  Adnexa:  not evaluated  Rectal Exam: Not performed.        Assessment:    Normal postpartum exam. Pap smear not done at today's visit.   Plan:    Contraception: tubal ligation RTC 1 year/prn sooner

## 2016-08-17 ENCOUNTER — Encounter (HOSPITAL_COMMUNITY): Payer: Self-pay

## 2016-11-16 ENCOUNTER — Encounter (HOSPITAL_COMMUNITY): Payer: Self-pay

## 2017-07-30 ENCOUNTER — Emergency Department (HOSPITAL_COMMUNITY): Admission: EM | Admit: 2017-07-30 | Discharge: 2017-07-30 | Discharge status: Against Medical Advice | Payer: Self-pay

## 2021-07-20 ENCOUNTER — Other Ambulatory Visit: Payer: Self-pay

## 2021-07-20 DIAGNOSIS — Z1231 Encounter for screening mammogram for malignant neoplasm of breast: Secondary | ICD-10-CM

## 2021-09-03 ENCOUNTER — Other Ambulatory Visit: Payer: Self-pay

## 2021-09-03 ENCOUNTER — Ambulatory Visit: Payer: Self-pay | Admitting: *Deleted

## 2021-09-03 ENCOUNTER — Ambulatory Visit
Admission: RE | Admit: 2021-09-03 | Discharge: 2021-09-03 | Disposition: A | Discharge status: Home / Self Care | Payer: Self-pay | Source: Ambulatory Visit | Attending: Obstetrics and Gynecology | Admitting: Obstetrics and Gynecology

## 2021-09-03 VITALS — BP 120/82 | Wt 161.0 lb

## 2021-09-03 DIAGNOSIS — Z1231 Encounter for screening mammogram for malignant neoplasm of breast: Secondary | ICD-10-CM

## 2021-09-03 DIAGNOSIS — Z1239 Encounter for other screening for malignant neoplasm of breast: Secondary | ICD-10-CM

## 2021-09-03 NOTE — Patient Instructions (Signed)
Explained breast self awareness with Rolinda Wanke. Patient did not need a Pap smear today due to last Pap smear and HPV typing was in July 2022 per patient. Let her know [facility] will cover Pap smears and HPV typing every 5 years unless has a history of abnormal Pap smears. Referred patient to the [facility] for a screening mammogram on mobile unit. Appointment scheduled Thursday, September 03, 2021 at 1050. Patient escorted to the mobile unit following [facility] appointment for her screening mammogram. Let patient know the [facility] will follow up with her within the next couple weeks with results of her mammogram by letter or phone. Brieonna Ciulla verbalized understanding.  Arriel Victor, Arvil Chaco, RN 9:54 AM

## 2021-09-03 NOTE — Progress Notes (Signed)
[name] is a 45 y.o. female who presents to [facility] clinic today with no complaints.    Pap Smear: Pap smear not completed today. Last Pap smear was in July 2022 at the [facility] clinic and was normal with negative HPV per patient. Per patient has no history of an abnormal Pap smear. Last Pap smear result is not available in Epic.   Physical exam: Breasts Breasts symmetrical. No skin abnormalities bilateral breasts. No nipple retraction bilateral breasts. No nipple discharge bilateral breasts. No lymphadenopathy. No lumps palpated bilateral breasts. No complaints of pain or tenderness on exam.       Pelvic/Bimanual Pap is not indicated today per [facility] guidelines.   Smoking History: Patient has never smoked.   Patient Navigation: Patient education provided. Access to services provided for patient through [facility] program.    Breast and Cervical Cancer Risk Assessment: Patient does not have family history of breast cancer, known genetic mutations, or radiation treatment to the chest before age 2. Patient does not have history of cervical dysplasia, immunocompromised, or DES exposure in-utero.  Risk Assessment     Risk Scores       09/03/2021   Last edited by: Kristine Beard, CMA   5-year risk: 0.6 %   Lifetime risk: 7.3 %            A: [facility] exam without pap smear No complaints.  P: Referred patient to the [facility] for a screening mammogram on mobile unit. Appointment scheduled Thursday, September 03, 2021 at 1050.  Kristine Parish, RN 09/03/2021 9:54 AM

## 2021-09-08 ENCOUNTER — Other Ambulatory Visit: Payer: Self-pay | Admitting: Obstetrics and Gynecology

## 2021-09-08 DIAGNOSIS — R928 Other abnormal and inconclusive findings on diagnostic imaging of breast: Secondary | ICD-10-CM

## 2021-10-19 ENCOUNTER — Ambulatory Visit
Admission: RE | Admit: 2021-10-19 | Discharge: 2021-10-19 | Disposition: A | Discharge status: Home / Self Care | Payer: No Typology Code available for payment source | Source: Ambulatory Visit | Attending: Obstetrics and Gynecology | Admitting: Obstetrics and Gynecology

## 2021-10-19 ENCOUNTER — Other Ambulatory Visit: Payer: Self-pay | Admitting: Obstetrics and Gynecology

## 2021-10-19 ENCOUNTER — Ambulatory Visit
Admission: RE | Admit: 2021-10-19 | Discharge: 2021-10-19 | Disposition: A | Discharge status: Home / Self Care | Payer: Self-pay | Source: Ambulatory Visit | Attending: Obstetrics and Gynecology | Admitting: Obstetrics and Gynecology

## 2021-10-19 DIAGNOSIS — N6489 Other specified disorders of breast: Secondary | ICD-10-CM

## 2021-10-19 DIAGNOSIS — R928 Other abnormal and inconclusive findings on diagnostic imaging of breast: Secondary | ICD-10-CM

## 2022-04-19 ENCOUNTER — Ambulatory Visit
Admission: RE | Admit: 2022-04-19 | Discharge: 2022-04-19 | Disposition: A | Discharge status: Home / Self Care | Payer: No Typology Code available for payment source | Source: Ambulatory Visit | Attending: Obstetrics and Gynecology | Admitting: Obstetrics and Gynecology

## 2022-04-19 ENCOUNTER — Ambulatory Visit: Payer: No Typology Code available for payment source

## 2022-04-19 DIAGNOSIS — N6489 Other specified disorders of breast: Secondary | ICD-10-CM

## 2022-06-23 ENCOUNTER — Telehealth: Payer: Self-pay

## 2022-06-23 NOTE — Telephone Encounter (Signed)
Telephoned patient at home number. Left voice message with [facility] contact information.

## 2023-07-04 IMAGING — MG MM DIGITAL DIAGNOSTIC UNILAT*R* W/ TOMO W/ CAD
4 series · 4 of 12 positions shown · non-contrast
Comparison: Prior films

CLINICAL DATA: Callback from screening mammogram for possible
asymmetry right breast

EXAM:
DIGITAL DIAGNOSTIC UNILATERAL RIGHT MAMMOGRAM WITH TOMOSYNTHESIS AND
CAD; ULTRASOUND RIGHT BREAST LIMITED
TECHNIQUE: Right digital diagnostic mammography and breast tomosynthesis was
performed. The images were evaluated with computer-aided detection.;
Targeted ultrasound examination of the right breast was performed

[R ML synth-2D]
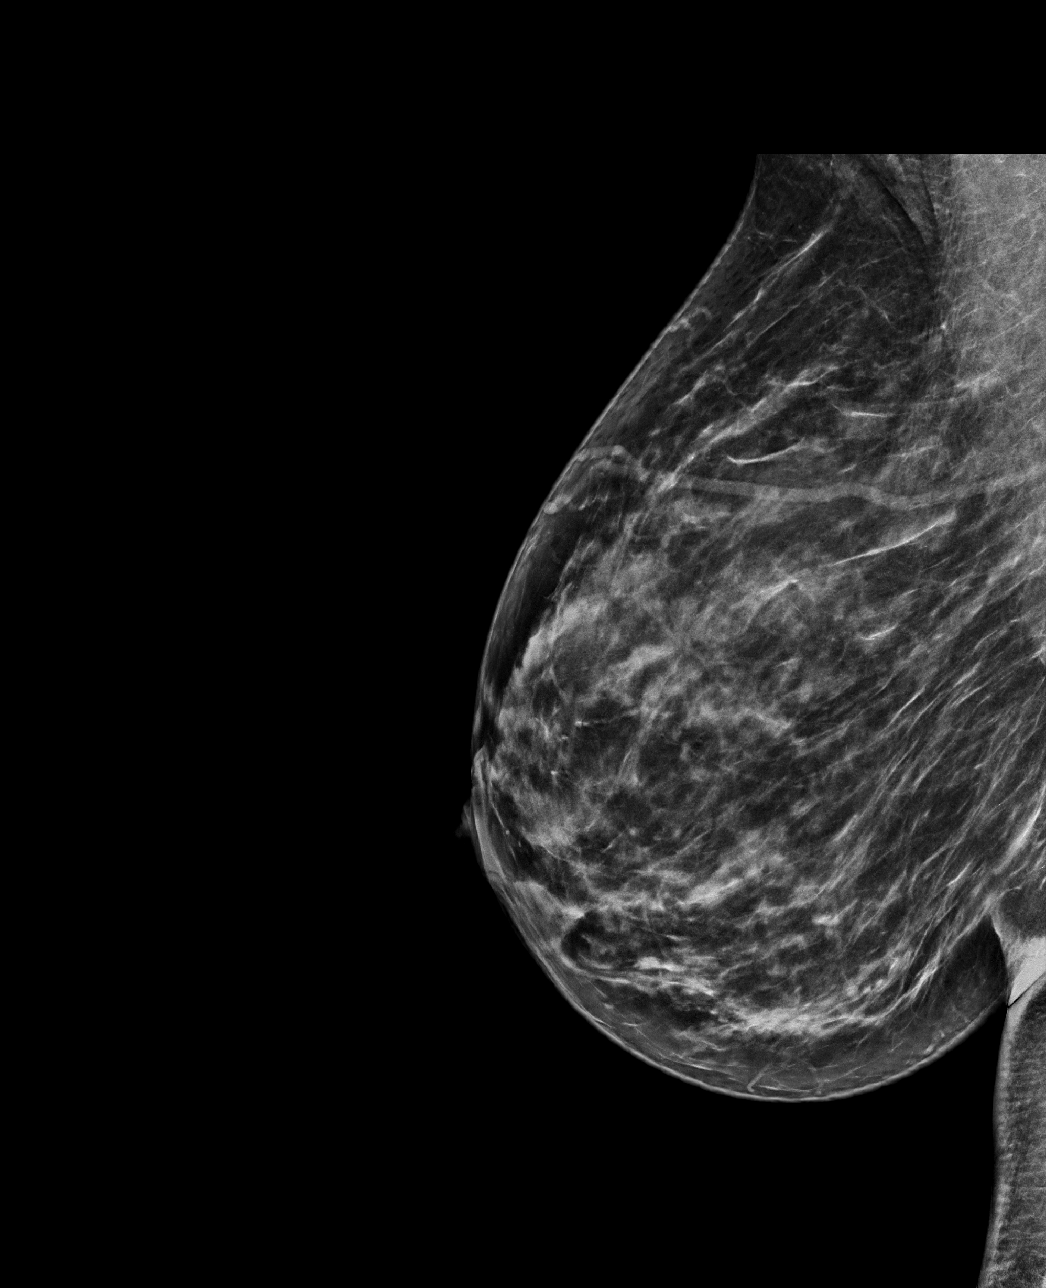

[R CC synth-2D]
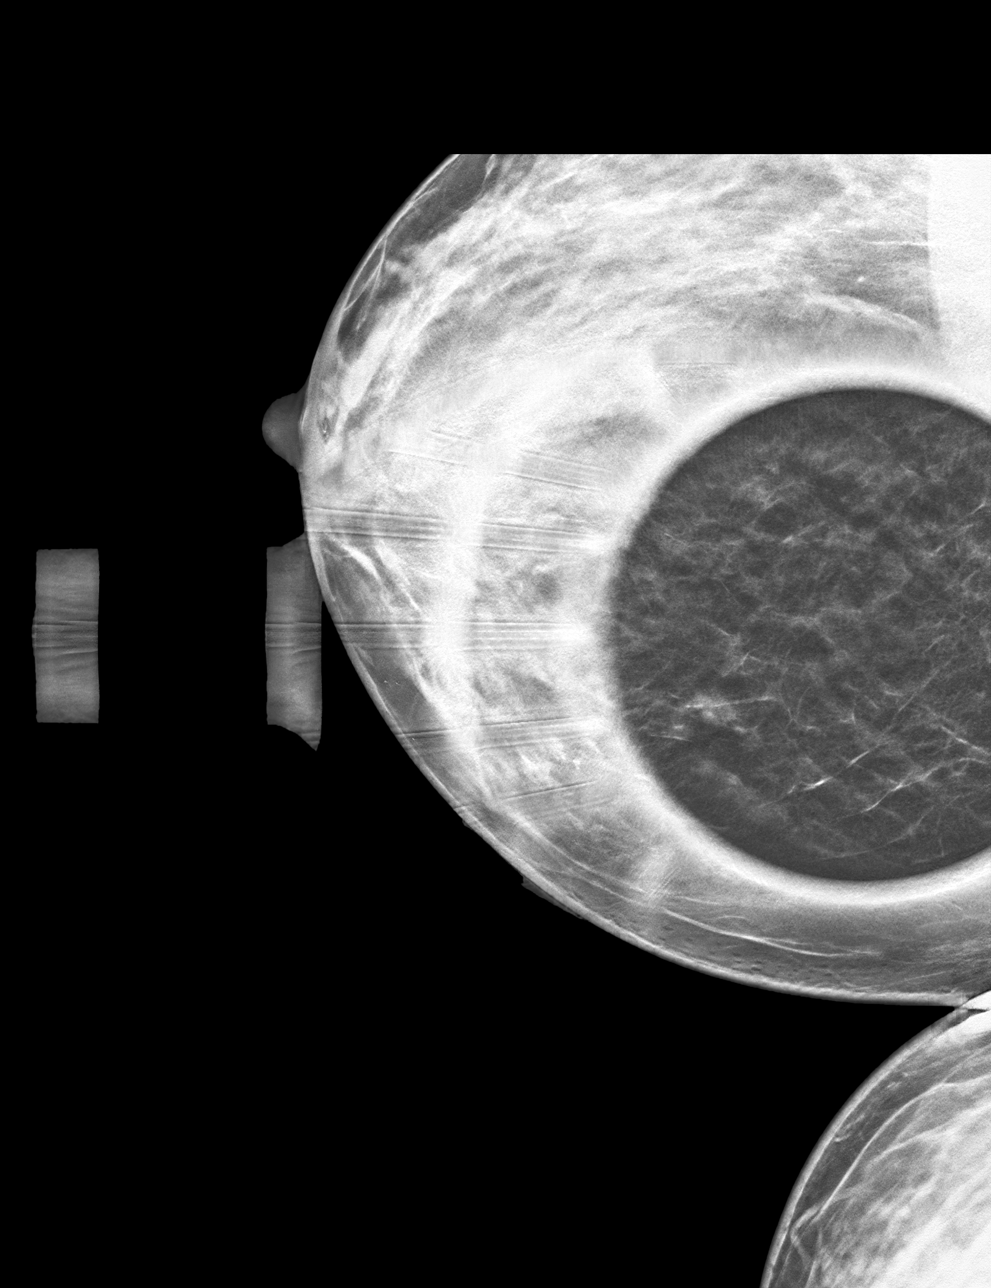

[R ML tomo · tomo slice 36/71.0]
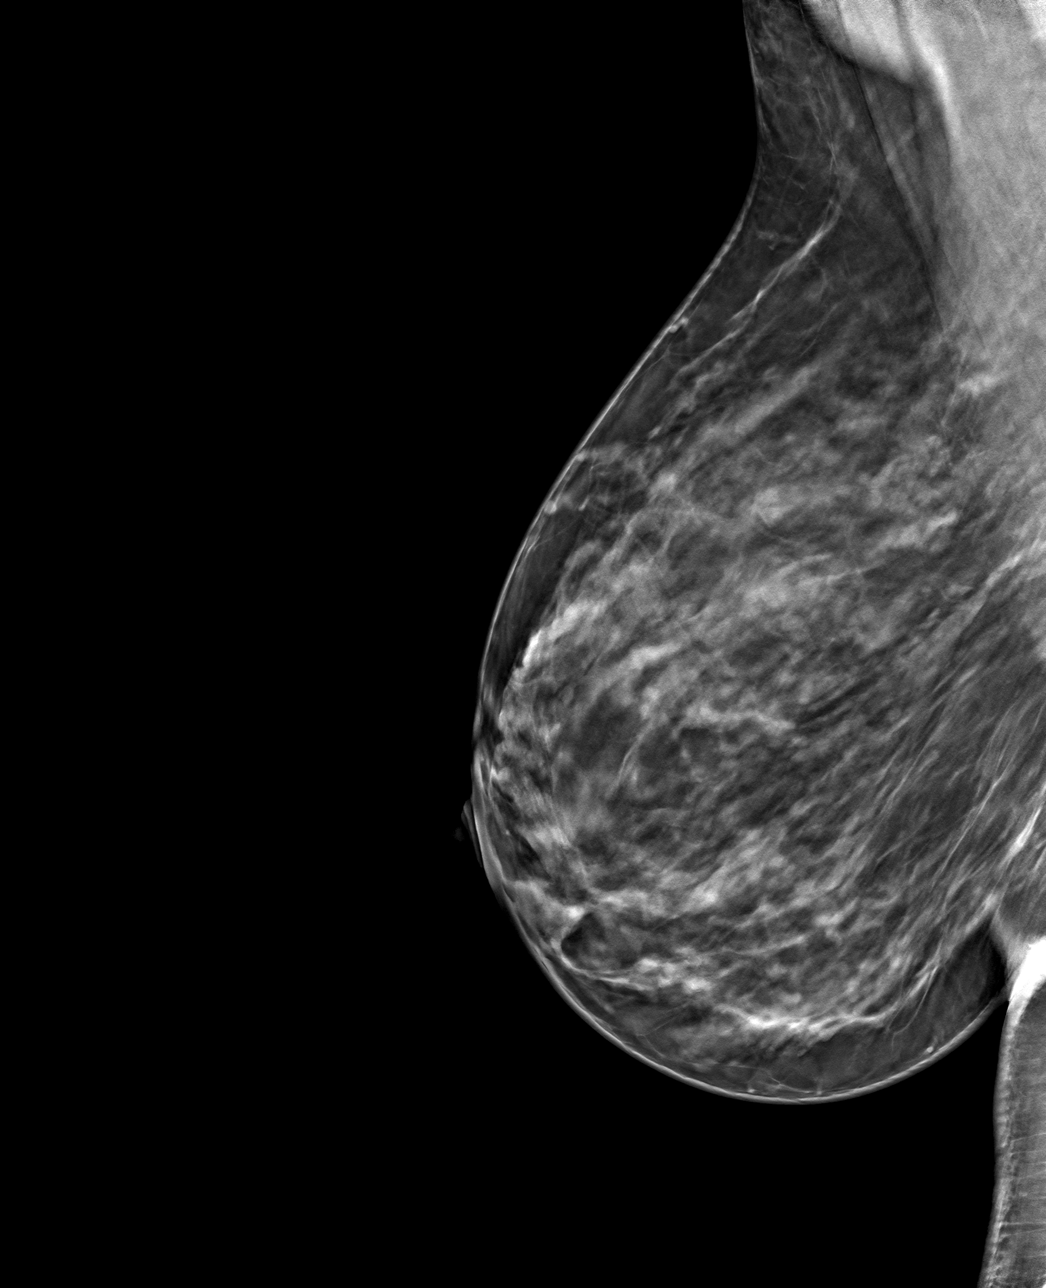

[R CC tomo · tomo slice 22/43.0]
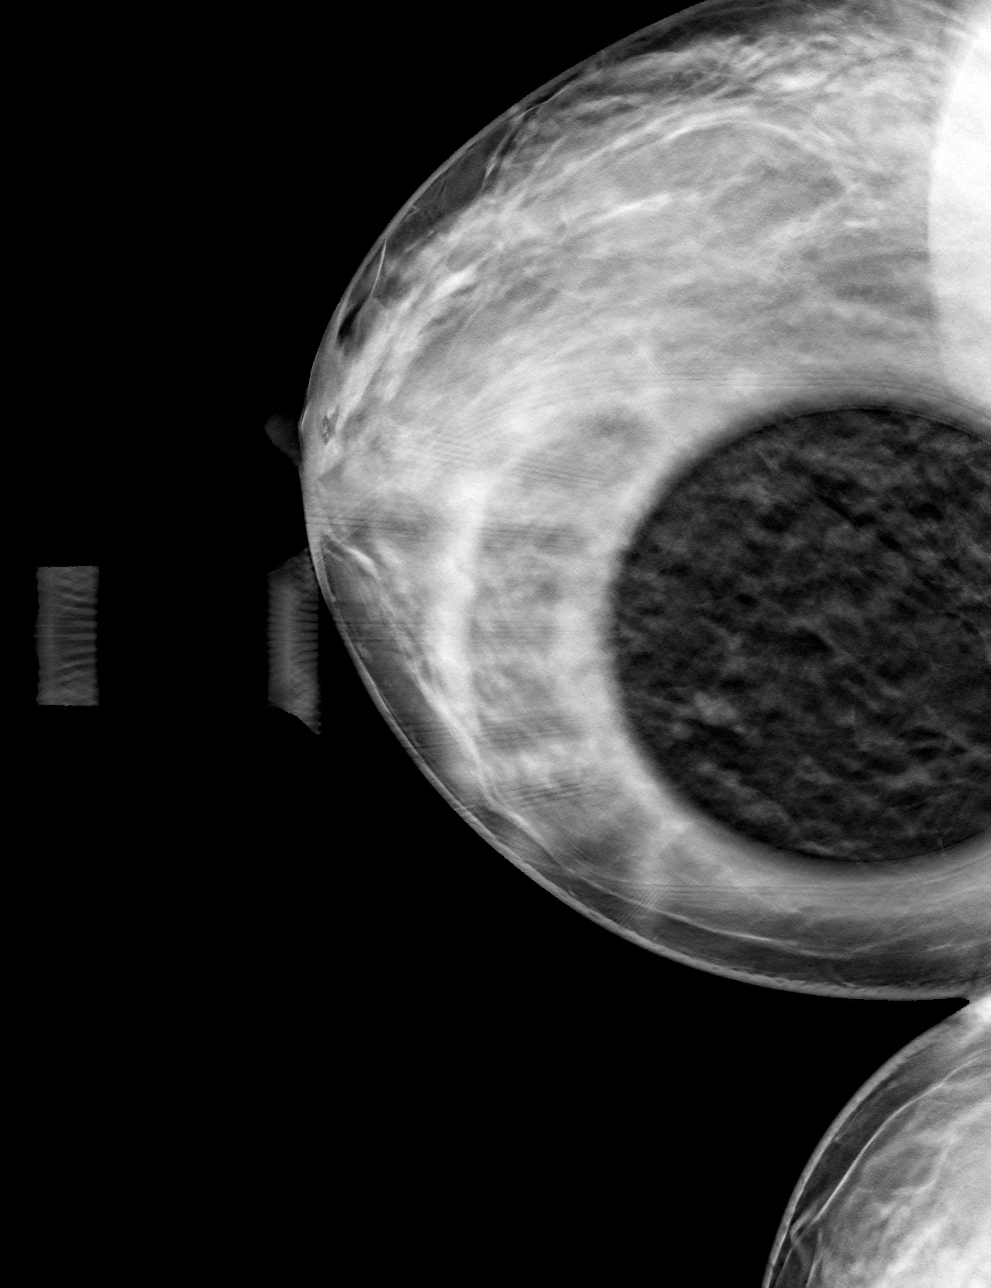

[4 of 12 positions shown; findings below may reference images not displayed]

ACR Breast Density Category c: The breast tissue is heterogeneously
dense, which may obscure small masses.
FINDINGS: Lateral view of right breast, spot compression right cc view are
submitted. The previously questioned asymmetry is less well seen on
additional views.

Targeted ultrasound is performed, showing no focal abnormal discrete
cystic or solid lesion is identified in the medial right breast.
IMPRESSION: Probable benign findings.

RECOMMENDATION:
Six-month follow-up mammogram right breast.

I have discussed the findings and recommendations with the patient.
If applicable, a reminder letter will be sent to the patient
regarding the next appointment.

BI-RADS CATEGORY  3: Probably benign.

## 2023-11-07 ENCOUNTER — Other Ambulatory Visit: Payer: Self-pay | Admitting: Obstetrics and Gynecology

## 2023-11-07 DIAGNOSIS — Z1231 Encounter for screening mammogram for malignant neoplasm of breast: Secondary | ICD-10-CM

## 2023-12-13 ENCOUNTER — Other Ambulatory Visit: Payer: Self-pay | Admitting: Family Medicine

## 2023-12-13 ENCOUNTER — Other Ambulatory Visit: Payer: Self-pay

## 2023-12-13 ENCOUNTER — Ambulatory Visit
Admission: RE | Admit: 2023-12-13 | Discharge: 2023-12-13 | Disposition: A | Discharge status: Home / Self Care | Payer: Self-pay | Source: Ambulatory Visit | Attending: Obstetrics and Gynecology | Admitting: Obstetrics and Gynecology

## 2023-12-13 ENCOUNTER — Ambulatory Visit: Payer: Self-pay

## 2023-12-13 DIAGNOSIS — Z1231 Encounter for screening mammogram for malignant neoplasm of breast: Secondary | ICD-10-CM

## 2024-03-14 ENCOUNTER — Other Ambulatory Visit: Payer: Self-pay
